# Patient Record
Sex: Female | Born: 1960 | Race: White | Hispanic: No | Smoking: Current every day smoker
Health system: Southern US, Community
[De-identification: ages and names within clinical notes are randomized; demographics above are authoritative.]

## PROBLEM LIST (undated history)

## (undated) DIAGNOSIS — E039 Hypothyroidism, unspecified: Secondary | ICD-10-CM

## (undated) DIAGNOSIS — E079 Disorder of thyroid, unspecified: Secondary | ICD-10-CM

## (undated) DIAGNOSIS — I219 Acute myocardial infarction, unspecified: Secondary | ICD-10-CM

## (undated) DIAGNOSIS — J439 Emphysema, unspecified: Secondary | ICD-10-CM

## (undated) DIAGNOSIS — E890 Postprocedural hypothyroidism: Secondary | ICD-10-CM

## (undated) DIAGNOSIS — F17200 Nicotine dependence, unspecified, uncomplicated: Secondary | ICD-10-CM

## (undated) DIAGNOSIS — E785 Hyperlipidemia, unspecified: Secondary | ICD-10-CM

## (undated) DIAGNOSIS — F419 Anxiety disorder, unspecified: Secondary | ICD-10-CM

## (undated) DIAGNOSIS — I10 Essential (primary) hypertension: Secondary | ICD-10-CM

## (undated) HISTORY — DX: Hypothyroidism, unspecified: E03.9

## (undated) HISTORY — DX: Nicotine dependence, unspecified, uncomplicated: F17.200

## (undated) HISTORY — DX: Hyperlipidemia, unspecified: E78.5

## (undated) HISTORY — PX: TRANSTHORACIC ECHOCARDIOGRAM: SHX275

## (undated) HISTORY — DX: Acute myocardial infarction, unspecified: I21.9

## (undated) HISTORY — PX: COLONOSCOPY: SHX174

## (undated) HISTORY — DX: Postprocedural hypothyroidism: E89.0

## (undated) HISTORY — DX: Emphysema, unspecified: J43.9

## (undated) HISTORY — PX: CORONARY ANGIOPLASTY WITH STENT PLACEMENT: SHX49

---

## 1998-04-30 ENCOUNTER — Inpatient Hospital Stay (HOSPITAL_COMMUNITY): Admission: AD | Admit: 1998-04-30 | Discharge: 1998-04-30 | Payer: Self-pay | Admitting: Obstetrics

## 1999-07-26 ENCOUNTER — Ambulatory Visit (HOSPITAL_COMMUNITY): Admission: RE | Admit: 1999-07-26 | Discharge: 1999-07-26 | Payer: Self-pay | Admitting: Gastroenterology

## 2000-11-27 ENCOUNTER — Other Ambulatory Visit: Admission: RE | Admit: 2000-11-27 | Discharge: 2000-11-27 | Payer: Self-pay | Admitting: Gastroenterology

## 2001-02-24 ENCOUNTER — Inpatient Hospital Stay (HOSPITAL_COMMUNITY): Admission: EM | Admit: 2001-02-24 | Discharge: 2001-02-28 | Payer: Self-pay | Admitting: Emergency Medicine

## 2001-02-26 ENCOUNTER — Encounter: Payer: Self-pay | Admitting: Internal Medicine

## 2001-02-28 ENCOUNTER — Encounter: Payer: Self-pay | Admitting: Internal Medicine

## 2001-12-04 ENCOUNTER — Emergency Department (HOSPITAL_COMMUNITY): Admission: EM | Admit: 2001-12-04 | Discharge: 2001-12-04 | Payer: Self-pay | Admitting: Emergency Medicine

## 2002-02-23 ENCOUNTER — Other Ambulatory Visit: Admission: RE | Admit: 2002-02-23 | Discharge: 2002-02-23 | Payer: Self-pay | Admitting: Internal Medicine

## 2004-04-17 ENCOUNTER — Other Ambulatory Visit: Admission: RE | Admit: 2004-04-17 | Discharge: 2004-04-17 | Payer: Self-pay | Admitting: Family Medicine

## 2004-05-01 ENCOUNTER — Encounter: Admission: RE | Admit: 2004-05-01 | Discharge: 2004-05-01 | Payer: Self-pay | Admitting: Family Medicine

## 2004-11-08 ENCOUNTER — Ambulatory Visit: Payer: Self-pay | Admitting: Family Medicine

## 2004-11-14 ENCOUNTER — Encounter: Admission: RE | Admit: 2004-11-14 | Discharge: 2004-11-14 | Payer: Self-pay | Admitting: Family Medicine

## 2004-11-21 ENCOUNTER — Encounter: Admission: RE | Admit: 2004-11-21 | Discharge: 2004-11-21 | Payer: Self-pay | Admitting: Family Medicine

## 2005-02-09 ENCOUNTER — Ambulatory Visit: Payer: Self-pay | Admitting: Family Medicine

## 2005-02-14 ENCOUNTER — Ambulatory Visit: Payer: Self-pay | Admitting: Gastroenterology

## 2005-02-15 ENCOUNTER — Encounter: Admission: RE | Admit: 2005-02-15 | Discharge: 2005-02-15 | Payer: Self-pay | Admitting: Family Medicine

## 2005-02-16 ENCOUNTER — Ambulatory Visit: Payer: Self-pay | Admitting: Gastroenterology

## 2005-03-13 ENCOUNTER — Ambulatory Visit: Payer: Self-pay | Admitting: Gastroenterology

## 2005-04-03 ENCOUNTER — Ambulatory Visit: Payer: Self-pay | Admitting: Family Medicine

## 2012-09-18 ENCOUNTER — Other Ambulatory Visit (HOSPITAL_COMMUNITY): Payer: Self-pay | Admitting: Nurse Practitioner

## 2012-09-18 DIAGNOSIS — Z139 Encounter for screening, unspecified: Secondary | ICD-10-CM

## 2012-09-23 ENCOUNTER — Ambulatory Visit (HOSPITAL_COMMUNITY)
Admission: RE | Admit: 2012-09-23 | Discharge: 2012-09-23 | Disposition: A | Discharge status: Home / Self Care | Payer: PRIVATE HEALTH INSURANCE | Source: Ambulatory Visit | Attending: Nurse Practitioner | Admitting: Nurse Practitioner

## 2012-09-23 DIAGNOSIS — Z139 Encounter for screening, unspecified: Secondary | ICD-10-CM

## 2012-09-30 ENCOUNTER — Other Ambulatory Visit: Payer: Self-pay | Admitting: Nurse Practitioner

## 2012-09-30 DIAGNOSIS — R928 Other abnormal and inconclusive findings on diagnostic imaging of breast: Secondary | ICD-10-CM

## 2012-10-13 ENCOUNTER — Other Ambulatory Visit (HOSPITAL_COMMUNITY): Payer: Self-pay | Admitting: Nurse Practitioner

## 2012-10-13 DIAGNOSIS — R928 Other abnormal and inconclusive findings on diagnostic imaging of breast: Secondary | ICD-10-CM

## 2012-10-22 ENCOUNTER — Ambulatory Visit (HOSPITAL_COMMUNITY)
Admission: RE | Admit: 2012-10-22 | Discharge: 2012-10-22 | Disposition: A | Discharge status: Home / Self Care | Payer: PRIVATE HEALTH INSURANCE | Source: Ambulatory Visit | Attending: Nurse Practitioner | Admitting: Nurse Practitioner

## 2012-10-22 ENCOUNTER — Other Ambulatory Visit (HOSPITAL_COMMUNITY): Payer: Self-pay | Admitting: Nurse Practitioner

## 2012-10-22 DIAGNOSIS — R928 Other abnormal and inconclusive findings on diagnostic imaging of breast: Secondary | ICD-10-CM | POA: Insufficient documentation

## 2016-06-13 ENCOUNTER — Other Ambulatory Visit: Payer: Self-pay | Admitting: Obstetrics and Gynecology

## 2016-06-13 DIAGNOSIS — R928 Other abnormal and inconclusive findings on diagnostic imaging of breast: Secondary | ICD-10-CM

## 2016-06-20 ENCOUNTER — Telehealth (HOSPITAL_COMMUNITY): Payer: Self-pay | Admitting: *Deleted

## 2016-06-20 NOTE — Telephone Encounter (Signed)
Telephoned patient at home # and left message about BCCCP appointment on July 13 2:30

## 2016-06-21 ENCOUNTER — Encounter (HOSPITAL_COMMUNITY): Payer: Self-pay

## 2016-06-21 ENCOUNTER — Ambulatory Visit (HOSPITAL_COMMUNITY): Payer: PRIVATE HEALTH INSURANCE

## 2016-06-21 ENCOUNTER — Ambulatory Visit (HOSPITAL_COMMUNITY)
Admission: RE | Admit: 2016-06-21 | Discharge: 2016-06-21 | Disposition: A | Discharge status: Home / Self Care | Payer: PRIVATE HEALTH INSURANCE | Source: Ambulatory Visit | Attending: Obstetrics and Gynecology | Admitting: Obstetrics and Gynecology

## 2016-06-21 ENCOUNTER — Other Ambulatory Visit: Payer: Self-pay | Admitting: Obstetrics and Gynecology

## 2016-06-21 ENCOUNTER — Ambulatory Visit
Admission: RE | Admit: 2016-06-21 | Discharge: 2016-06-21 | Disposition: A | Discharge status: Home / Self Care | Payer: Self-pay | Source: Ambulatory Visit | Attending: Obstetrics and Gynecology | Admitting: Obstetrics and Gynecology

## 2016-06-21 VITALS — BP 116/84 | Temp 97.6°F | Ht 67.0 in | Wt 123.6 lb

## 2016-06-21 DIAGNOSIS — R928 Other abnormal and inconclusive findings on diagnostic imaging of breast: Secondary | ICD-10-CM

## 2016-06-21 DIAGNOSIS — Z01419 Encounter for gynecological examination (general) (routine) without abnormal findings: Secondary | ICD-10-CM

## 2016-06-21 HISTORY — DX: Disorder of thyroid, unspecified: E07.9

## 2016-06-21 HISTORY — DX: Essential (primary) hypertension: I10

## 2016-06-21 NOTE — Progress Notes (Addendum)
Complaints of vaginal itching and redness.  Pap Smear: Pap smear completed today. Last Pap smear was 5 years ago and normal per patient. Per patient has a history of an abnormal Pap smear 22-24 years ago that cryotherapy was completed for follow up. Per patient all Pap smears have been normal since cryotherapy. No Pap smear results are in EPIC.  Physical exam: Breasts Breasts symmetrical. No skin abnormalities bilateral breasts. No nipple retraction bilateral breasts. No nipple discharge bilateral breasts. No lymphadenopathy. No lumps palpated bilateral breasts. No complaints of pain or tenderness on exam.  [facility] for a screening mammogram. Appointment scheduled for Thursday, June 21, 2016 at 1500.   Pelvic/Bimanual   Ext Genitalia A reddened area consistent with yeast was observed on external genitalia. No lesions, no swelling and no discharge observed on external genitalia. Recommended patient to try OTC Monistat.        Vagina Vagina pink and normal texture. No lesions and small amount of white cottage cheese discharge observed in vagina consistent with yeast. Recommended OTC Monistat and told patient to call if gets worse or if not better within a week. Told [facility] to [facility] a wet prep completed if discharge becomes worse. Patient requested STD testing. Told patient she can be screened at the [facility] free of charge.          Cervix Cervix is present. Cervix pink and of normal texture. No discharge observed.     Uterus Uterus is present and palpable. Uterus in normal position and normal size.        Adnexae Bilateral ovaries present and palpable. No tenderness on palpation.          Rectovaginal No rectal exam completed today since patient had no rectal complaints. No skin abnormalities observed on exam.     Smoking History: Patient is a current smoker. Discussed smoking cessation with patient.  [facility] and told her about the free classes offered at the [facility].  Patient Navigation: Patient education provided. Access to services provided for [facility].   Colorectal Cancer Screening: Patient had a colonoscopy completed in 2012. No complaints today.

## 2016-06-21 NOTE — Addendum Note (Signed)
Encounter addended by: Priscille Heidelberghristine P Brannock, RN on: 06/21/2016  3:23 PM<BR>     Documentation filed: Notes Section

## 2016-06-21 NOTE — Patient Instructions (Signed)
Educational materials on self breast awareness given. Explained to Rachael Duncan that  B[facility]CP will cover Pap smears and HPV typing every 5 years unless has a history of abnormal Pap smears. [facility] screening mamm[facility]. Appointment scheduled for Thursday, June 21, 2016 at 1500. Let patient know the Breast Center will follow up with her within the next couple weeks with results of mammogram by letter or phone. Discussed smoking cessation with patient. [facility] the [facility] and told her about the free classes offered at the [facility]. Rachael Duncan verbalized understanding.  Brannock, Kathaleen Maserhristine Poll, RN 3:08 PM

## 2016-06-22 ENCOUNTER — Encounter (HOSPITAL_COMMUNITY): Payer: Self-pay | Admitting: *Deleted

## 2016-06-22 LAB — CYTOLOGY - PAP

## 2016-06-25 ENCOUNTER — Telehealth (HOSPITAL_COMMUNITY): Payer: Self-pay | Admitting: *Deleted

## 2016-06-25 NOTE — Telephone Encounter (Signed)
Telephoned patient at home # and discussed negative pap smear results. HPV was negative. Next pap smear due in 5 years. Patient voiced understanding.  

## 2016-07-13 ENCOUNTER — Telehealth: Payer: Self-pay

## 2016-07-13 NOTE — Telephone Encounter (Signed)
Patient called back from previous call and I returned her call. Explained about [employer], Patient stated that had just gotten lab done and has not heard back from office. Patient stated that was interested but needed to see what lab work was first. Patient stated that would call back if needs to.

## 2016-07-13 NOTE — Telephone Encounter (Signed)
Called to inform about [facility] Program. Left voice mail to return call if interested.

## 2017-10-03 ENCOUNTER — Other Ambulatory Visit: Payer: Self-pay | Admitting: Obstetrics and Gynecology

## 2017-10-03 DIAGNOSIS — Z1231 Encounter for screening mammogram for malignant neoplasm of breast: Secondary | ICD-10-CM

## 2017-10-17 ENCOUNTER — Ambulatory Visit (HOSPITAL_COMMUNITY): Payer: No Typology Code available for payment source

## 2017-11-28 ENCOUNTER — Encounter (HOSPITAL_COMMUNITY): Payer: Self-pay

## 2017-11-28 ENCOUNTER — Ambulatory Visit (HOSPITAL_COMMUNITY)
Admission: RE | Admit: 2017-11-28 | Discharge: 2017-11-28 | Disposition: A | Discharge status: Home / Self Care | Payer: No Typology Code available for payment source | Source: Ambulatory Visit | Attending: Obstetrics and Gynecology | Admitting: Obstetrics and Gynecology

## 2017-11-28 ENCOUNTER — Ambulatory Visit
Admission: RE | Admit: 2017-11-28 | Discharge: 2017-11-28 | Disposition: A | Discharge status: Home / Self Care | Payer: No Typology Code available for payment source | Source: Ambulatory Visit | Attending: Obstetrics and Gynecology | Admitting: Obstetrics and Gynecology

## 2017-11-28 VITALS — BP 102/68 | Temp 97.6°F | Ht 67.0 in | Wt 120.0 lb

## 2017-11-28 DIAGNOSIS — Z1231 Encounter for screening mammogram for malignant neoplasm of breast: Secondary | ICD-10-CM

## 2017-11-28 DIAGNOSIS — Z1239 Encounter for other screening for malignant neoplasm of breast: Secondary | ICD-10-CM

## 2017-11-28 NOTE — Progress Notes (Signed)
No complaints today.   Pap Smear: Pap smear not completed today. [facility] and normal with negative HPV. Per patient has a history of an abnormal Pap smear 23-25 years ago that cryotherapy was completed for follow up. Per patient all Pap smears have been normal since cryotherapy. Last Pap smear result is in Epic.  Physical exam: Breasts Breasts symmetrical. No skin abnormalities bilateral breasts. No nipple retraction bilateral breasts. No nipple discharge bilateral breasts. No lymphadenopathy. No lumps palpated bilateral breasts. No complaints of pain or tenderness on exam. [facility] for a screening mamm[facility]. Appointment scheduled for Thursday, November 28, 2017 at 1410.      Pelvic/Bimanual No Pap smear completed today since last Pap smear and HPV typing was 06/21/2016. Pap smear not indicated per BCCCP guidelines.   Smoking History: Patient is a current smoker. Discussed smoking cessation with patient. Referred patient to the [facility]line and gave resources to free smoking cessation classes at [facility].  Patient Navigation: Patient education provided. Access to services provided for [facility] through [facility].   Colorectal Cancer Screening: Per patient had a colonoscopy completed in 2003. No complaints today. FIT Test given to patient to complete and return to BCCCP.

## 2017-11-28 NOTE — Patient Instructions (Addendum)
Explained breast self awareness with Harle Stanfordenise D Smith. Patient did not need a Pap smear today due to last Pap smear and HPV typing was 06/21/2016. Let her know BCCCP will cover smears and HPV typing every 5 years unless has a history of abnormal Pap smears. [facility] screening mamm[facility]. Appointment scheduled for Thursday, November 28, 2017 at 1410. Let patient know the Breast Center will follow up with her within the next couple weeks with results of mammogram by letter or phone. Discussed smoking cessation with patient. Referred patient to the [facility] and gave resources to free smoking cessation classes at [facility]. Harle Stanfordenise D Smith verbalized understanding.  Mykenzi Vanzile, Kathaleen Maserhristine Poll, RN 1:10 PM

## 2017-11-29 ENCOUNTER — Encounter (HOSPITAL_COMMUNITY): Payer: Self-pay | Admitting: *Deleted

## 2017-12-16 ENCOUNTER — Other Ambulatory Visit: Payer: Self-pay | Admitting: Obstetrics and Gynecology

## 2017-12-29 LAB — FECAL OCCULT BLOOD, IMMUNOCHEMICAL: Fecal Occult Bld: NEGATIVE

## 2017-12-30 ENCOUNTER — Encounter (HOSPITAL_COMMUNITY): Payer: Self-pay | Admitting: *Deleted

## 2017-12-30 NOTE — Progress Notes (Signed)
Negative Fit Test results mailed to patient.  

## 2018-09-08 ENCOUNTER — Other Ambulatory Visit: Payer: Self-pay | Admitting: Obstetrics and Gynecology

## 2018-09-08 DIAGNOSIS — Z1231 Encounter for screening mammogram for malignant neoplasm of breast: Secondary | ICD-10-CM

## 2018-09-17 ENCOUNTER — Telehealth: Payer: Self-pay | Admitting: *Deleted

## 2018-10-03 ENCOUNTER — Telehealth: Payer: Self-pay | Admitting: Family Medicine

## 2018-10-03 NOTE — Telephone Encounter (Signed)
Called patient to get her scheduled to come in this office to be seen. She stated she was going to [facility].

## 2018-10-06 ENCOUNTER — Encounter: Payer: No Typology Code available for payment source | Admitting: Family Medicine

## 2018-10-09 ENCOUNTER — Encounter: Payer: No Typology Code available for payment source | Admitting: Obstetrics & Gynecology

## 2018-12-11 ENCOUNTER — Encounter (HOSPITAL_COMMUNITY): Payer: Self-pay

## 2018-12-11 ENCOUNTER — Ambulatory Visit
Admission: RE | Admit: 2018-12-11 | Discharge: 2018-12-11 | Disposition: A | Discharge status: Home / Self Care | Payer: Self-pay | Source: Ambulatory Visit | Attending: Obstetrics and Gynecology | Admitting: Obstetrics and Gynecology

## 2018-12-11 ENCOUNTER — Ambulatory Visit (HOSPITAL_COMMUNITY)
Admission: RE | Admit: 2018-12-11 | Discharge: 2018-12-11 | Disposition: A | Discharge status: Home / Self Care | Payer: No Typology Code available for payment source | Source: Ambulatory Visit | Attending: Obstetrics and Gynecology | Admitting: Obstetrics and Gynecology

## 2018-12-11 VITALS — BP 128/86 | Wt 118.0 lb

## 2018-12-11 DIAGNOSIS — Z1231 Encounter for screening mammogram for malignant neoplasm of breast: Secondary | ICD-10-CM

## 2018-12-11 DIAGNOSIS — Z1239 Encounter for other screening for malignant neoplasm of breast: Secondary | ICD-10-CM

## 2018-12-11 HISTORY — DX: Anxiety disorder, unspecified: F41.9

## 2018-12-11 NOTE — Progress Notes (Signed)
No complaints today.   Pap Smear: Pap smear not completed today. Last Pap smear was 06/21/2016 in [facility] and normal with negative HPV. Per patient has a history of an abnormal Pap smear 24-26 years ago that cryotherapy was completed for follow up. Per patient all Pap smears have been normal since cryotherapy. Last Pap smear result is in Epic.  Physical exam: Breasts Breasts symmetrical. No skin abnormalities bilateral breasts. No nipple retraction bilateral breasts. No nipple discharge bilateral breasts. No lymphadenopathy. No lumps palpated bilateral breasts. No complaints of pain or tenderness on exam. Referred patient to the [facility] for a screening mammogram. Appointment scheduled for Thursday, December 11, 2018 at 1500.          Pelvic/Bimanual No Pap smear completed today since last Pap smear and HPV typing was 06/21/2016. Pap smear not indicated per BCCCP guidelines.  Smoking History: Patient is a current smoker. Discussed smoking cessation with patient. Referred patient to the [facility] Quitline and gave resources to free smoking cessation classes at [facility].  Patient Navigation: Patient education provided. Access to services provided for patient through [facility] program. Spanish interpreter provided.   Colorectal Cancer Screening: Per patient had a colonoscopy completed in 2003. No complaints today. FIT Test given to patient to complete and return to BCCCP.  Breast and Cervical Cancer Risk Assessment: Patient has no family history of breast cancer, known genetic mutations, or radiation treatment to the chest before age 10. Patient has no history of cervical dysplasia, immunocompromised, or DES exposure in-utero.  Risk Assessment    Risk Scores      12/11/2018   Last edited by: Lynnell Dike, LPN   5-year risk: 1 %   Lifetime risk: 6.7 %

## 2018-12-11 NOTE — Patient Instructions (Addendum)
Explained breast self awareness with Rachael Duncan. Patient did not need a Pap smear today due to last Pap smear and HPV typing was 06/21/2016. Let her know BCCCP will cover Pap smears and HPV typing every 5 years unless has a history of abnormal Pap smears. [facility] mamm[facility]. Appointment scheduled for Thursday, December 11, 2018 at 1500. Patient aware of appointment and will be there. Let patient know the Breast Center will follow up with her within the next couple weeks with results of mammogram by letter or phone. Discussed smoking cessation with patient. Ref[facility] the [facility] and gave resources to free smoking cessation classes at [facility]. Rachael Duncan verbalized understanding.  Brannock, Kathaleen Maserhristine Poll, RN 1:59 PM

## 2018-12-11 NOTE — Addendum Note (Signed)
Encounter addended by: Priscille Heidelberg, RN on: 12/11/2018 2:04 PM  Actions taken: Clinical Note Signed

## 2018-12-12 ENCOUNTER — Encounter (HOSPITAL_COMMUNITY): Payer: Self-pay | Admitting: *Deleted

## 2018-12-16 ENCOUNTER — Other Ambulatory Visit: Payer: Self-pay

## 2018-12-28 LAB — FECAL OCCULT BLOOD, IMMUNOCHEMICAL: Fecal Occult Bld: POSITIVE — AB

## 2018-12-31 ENCOUNTER — Telehealth (HOSPITAL_COMMUNITY): Payer: Self-pay | Admitting: *Deleted

## 2018-12-31 NOTE — Telephone Encounter (Signed)
Telephoned patient to give her results for her stool card and to suggest [facility].  Patient stated that her husband just died and she is too stressed at the moment to do [facility]. Patient stated that I should call back in February.

## 2019-06-30 IMAGING — MG DIGITAL SCREENING BILATERAL MAMMOGRAM WITH TOMO AND CAD
8 series · 9 of 24 positions shown · non-contrast
Comparison: Previous exam(s).

CLINICAL DATA: Screening.

EXAM:
DIGITAL SCREENING BILATERAL MAMMOGRAM WITH TOMO AND CAD

[R MLO synth-2D]
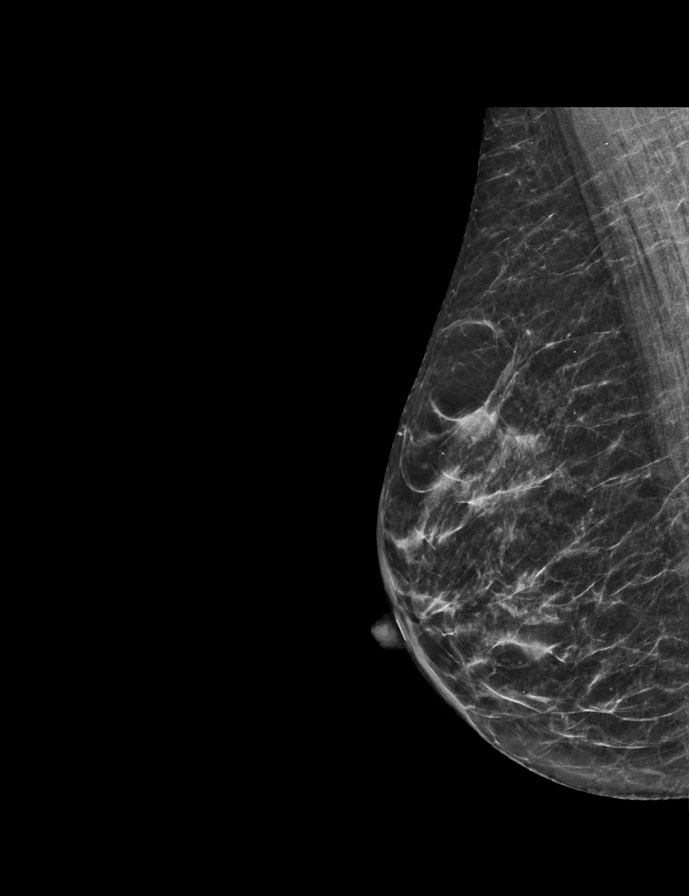

[L MLO synth-2D]
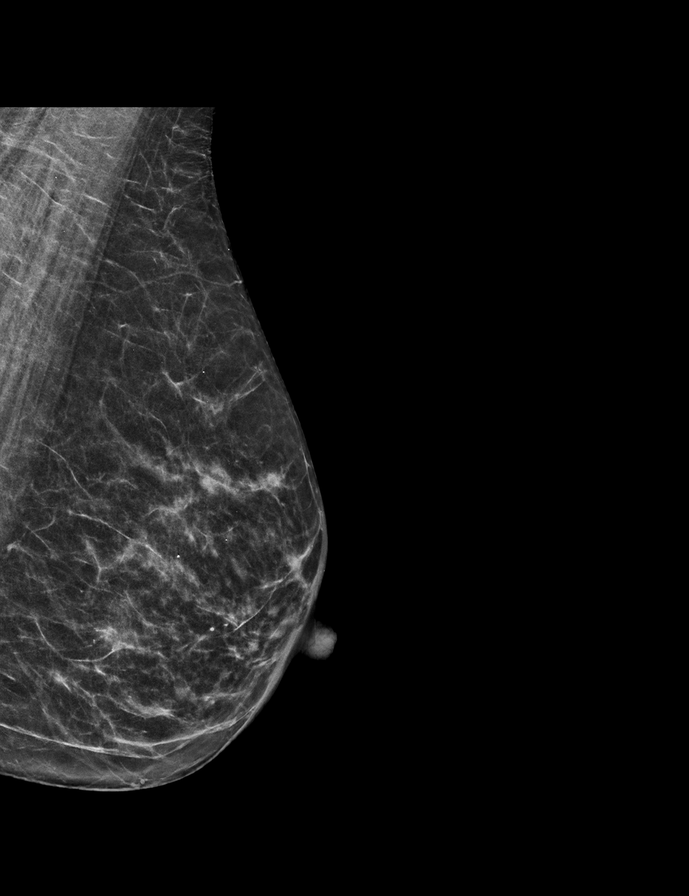

[R CC synth-2D]
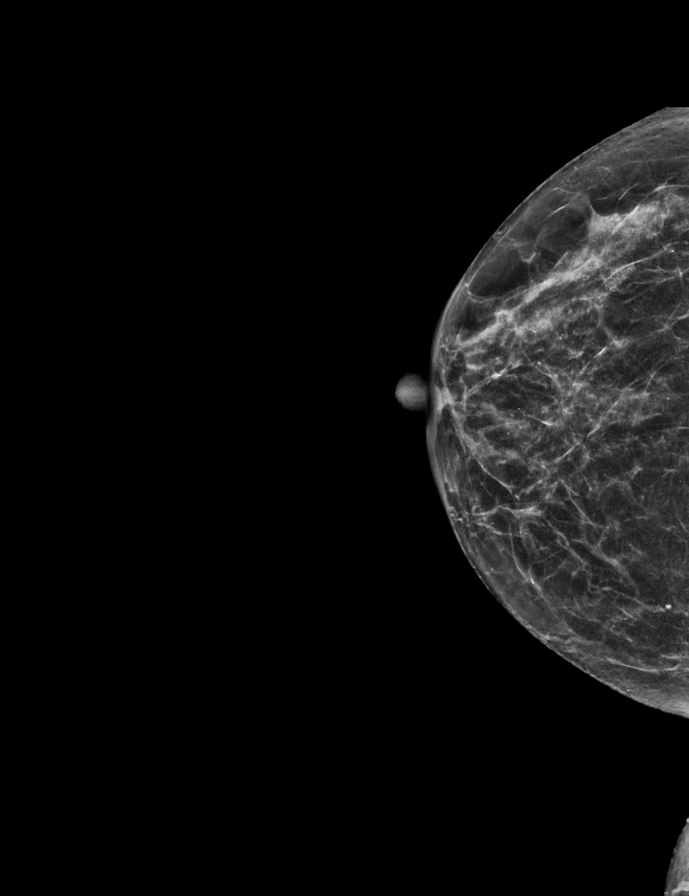

[L CC synth-2D]
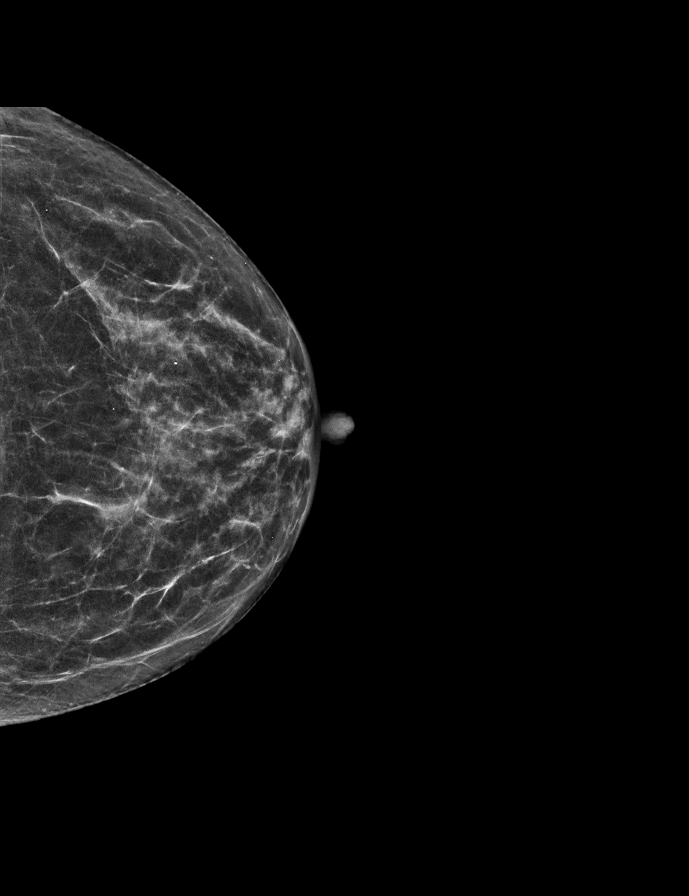

[R CC tomo · 2 of 48 frames shown]
[frame 16/48]
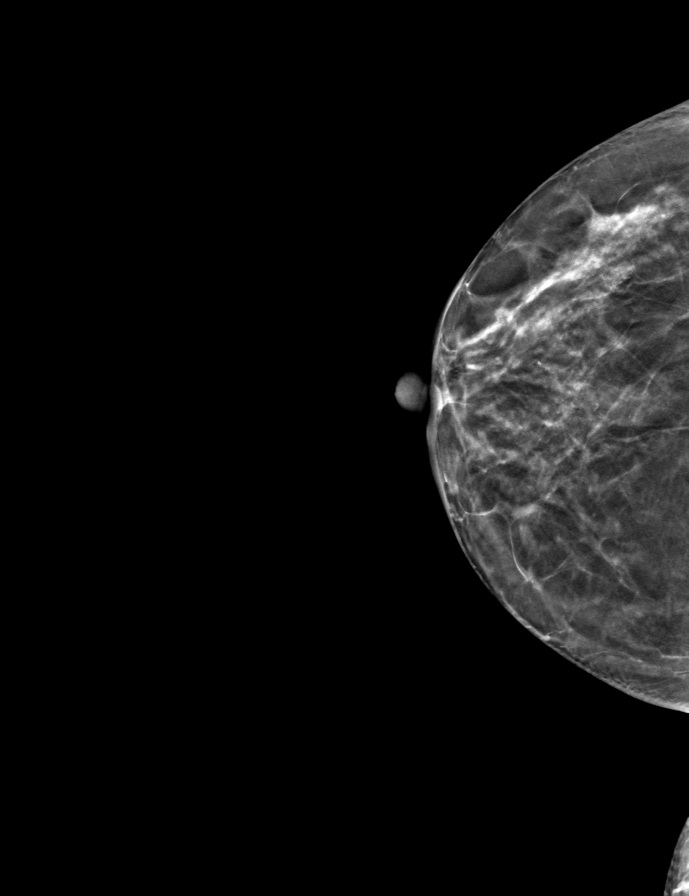
[frame 25/48]
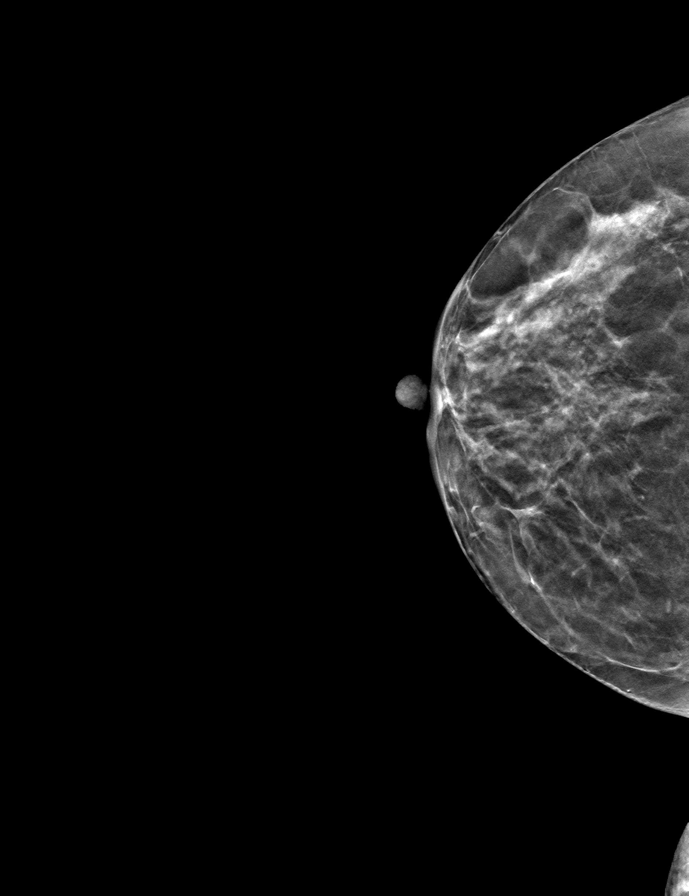

[R MLO tomo · tomo slice 25/48.0]
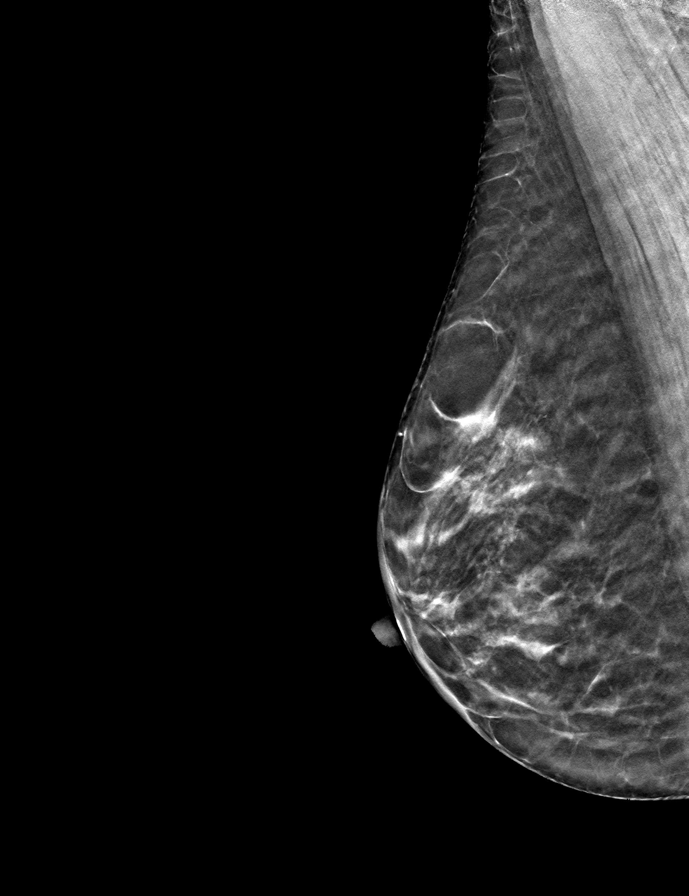

[L CC tomo · tomo slice 27/52.0]
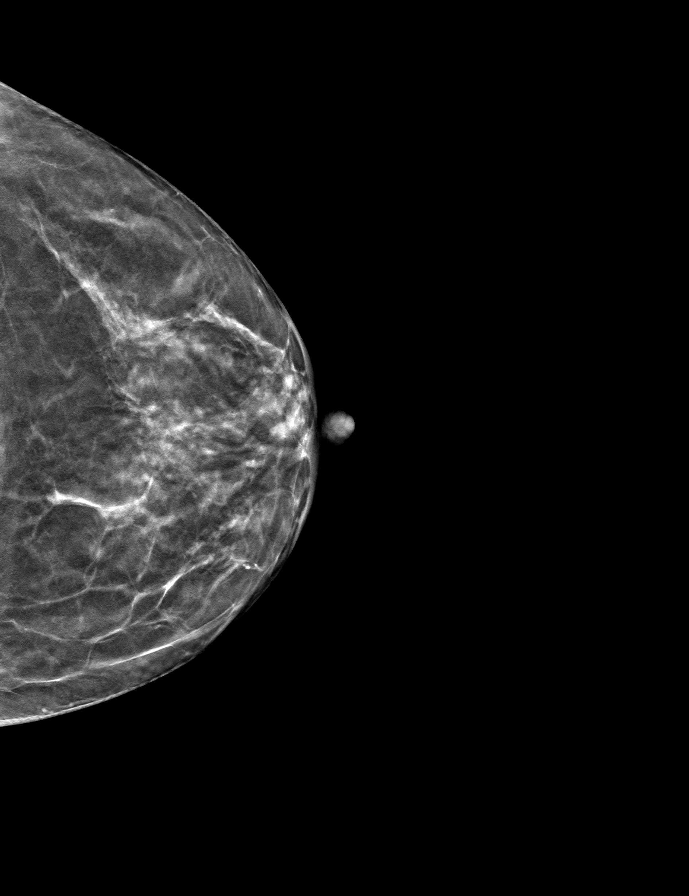

[L MLO tomo · tomo slice 26/51.0]
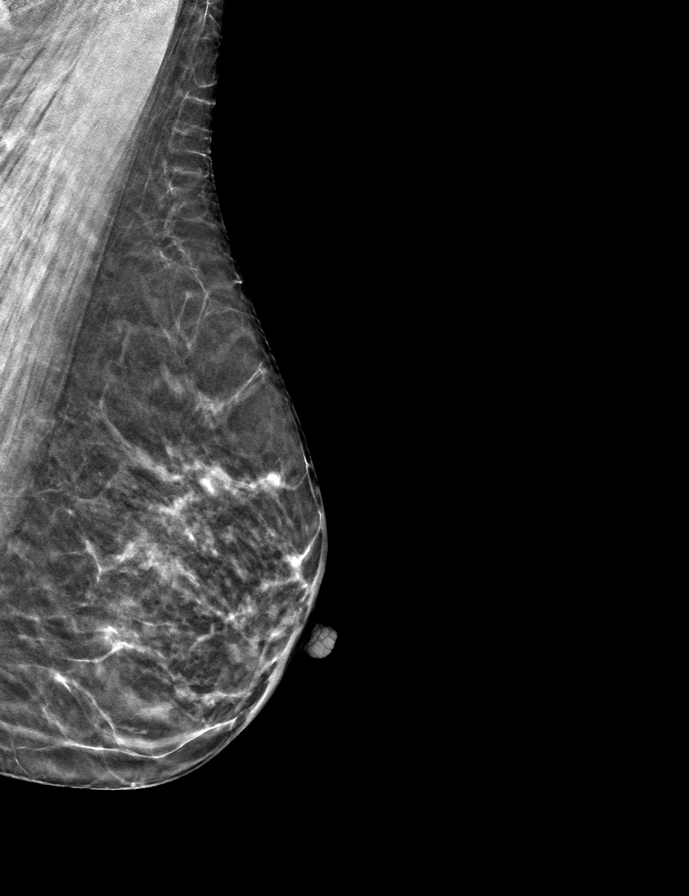

[9 of 24 positions shown; findings below may reference images not displayed]

ACR Breast Density Category b: There are scattered areas of
fibroglandular density.
FINDINGS: There are no findings suspicious for malignancy. Images were
processed with CAD.
IMPRESSION: No mammographic evidence of malignancy. A result letter of this
screening mammogram will be mailed directly to the patient.

RECOMMENDATION:
Screening mammogram in one year. (Code:[id number])

BI-RADS CATEGORY  1: Negative.

## 2020-03-17 ENCOUNTER — Ambulatory Visit: Payer: PRIVATE HEALTH INSURANCE | Attending: Internal Medicine

## 2020-03-17 DIAGNOSIS — Z23 Encounter for immunization: Secondary | ICD-10-CM

## 2020-03-17 NOTE — Progress Notes (Signed)
   Covid-19 Vaccination Clinic  Name:  Rachael Duncan    MRN: [id number] DOB: May 03, 1961  03/17/2020  Rachael Duncan was observed post Covid-19 immunization for 15 minutes without incident. She was provided with Vaccine Information Sheet and instruction to access the V-Safe system.   Rachael Duncan was instructed to call 911 with any severe reactions post vaccine: Marland Kitchen Difficulty breathing  . Swelling of face and throat  . A fast heartbeat  . A bad rash all over body  . Dizziness and weakness   Immunizations Administered    Name Date Dose VIS Date Route   Pfizer COVID-19 Vaccine 03/17/2020  1:37 PM 0.3 mL 11/20/2019 Intramuscular   Manufacturer: [employer], [employer]   Lot: [id number]   NDC: [address]-7

## 2020-04-11 ENCOUNTER — Ambulatory Visit: Payer: PRIVATE HEALTH INSURANCE | Attending: Internal Medicine

## 2020-04-11 DIAGNOSIS — Z23 Encounter for immunization: Secondary | ICD-10-CM

## 2020-04-11 NOTE — Progress Notes (Signed)
   Covid-19 Vaccination Clinic  Name:  Rachael Duncan    MRN: [id number] DOB: August 18, 1961  04/11/2020  [name] was observed post Covid-19 immunization for 15 minutes without incident. She was provided with Vaccine Information Sheet and instruction to access the V-Safe system.   [name] was instructed to call 911 with any severe reactions post vaccine: Marland Kitchen Difficulty breathing  . Swelling of face and throat  . A fast heartbeat  . A bad rash all over body  . Dizziness and weakness   Immunizations Administered    Name Date Dose VIS Date Route   Pfizer COVID-19 Vaccine 04/11/2020  1:34 PM 0.3 mL 02/03/2019 Intramuscular   Manufacturer: [employer], [employer]   Lot: [id number]   NDC: [address]-3

## 2022-12-05 ENCOUNTER — Other Ambulatory Visit: Payer: Self-pay | Admitting: Family Medicine

## 2022-12-05 DIAGNOSIS — Z1231 Encounter for screening mammogram for malignant neoplasm of breast: Secondary | ICD-10-CM

## 2023-02-06 ENCOUNTER — Ambulatory Visit
Admission: RE | Admit: 2023-02-06 | Discharge: 2023-02-06 | Disposition: A | Discharge status: Home / Self Care | Payer: PRIVATE HEALTH INSURANCE | Source: Ambulatory Visit | Attending: Family Medicine | Admitting: Family Medicine

## 2023-02-06 DIAGNOSIS — Z1231 Encounter for screening mammogram for malignant neoplasm of breast: Secondary | ICD-10-CM

## 2023-06-19 ENCOUNTER — Other Ambulatory Visit: Payer: Self-pay | Admitting: Family Medicine

## 2023-06-19 DIAGNOSIS — Z13820 Encounter for screening for osteoporosis: Secondary | ICD-10-CM

## 2023-06-19 DIAGNOSIS — F17200 Nicotine dependence, unspecified, uncomplicated: Secondary | ICD-10-CM

## 2024-01-24 ENCOUNTER — Ambulatory Visit: Payer: Medicaid Other | Admitting: Urgent Care

## 2024-01-24 ENCOUNTER — Encounter: Payer: Self-pay | Admitting: Urgent Care

## 2024-01-24 VITALS — BP 134/84 | HR 72 | Ht 64.0 in | Wt 119.8 lb

## 2024-01-24 DIAGNOSIS — Z114 Encounter for screening for human immunodeficiency virus [HIV]: Secondary | ICD-10-CM | POA: Diagnosis not present

## 2024-01-24 DIAGNOSIS — J432 Centrilobular emphysema: Secondary | ICD-10-CM

## 2024-01-24 DIAGNOSIS — E890 Postprocedural hypothyroidism: Secondary | ICD-10-CM

## 2024-01-24 DIAGNOSIS — Z122 Encounter for screening for malignant neoplasm of respiratory organs: Secondary | ICD-10-CM

## 2024-01-24 DIAGNOSIS — Z1159 Encounter for screening for other viral diseases: Secondary | ICD-10-CM

## 2024-01-24 DIAGNOSIS — I10 Essential (primary) hypertension: Secondary | ICD-10-CM

## 2024-01-24 DIAGNOSIS — F419 Anxiety disorder, unspecified: Secondary | ICD-10-CM | POA: Diagnosis not present

## 2024-01-24 DIAGNOSIS — S51812A Laceration without foreign body of left forearm, initial encounter: Secondary | ICD-10-CM

## 2024-01-24 DIAGNOSIS — Z131 Encounter for screening for diabetes mellitus: Secondary | ICD-10-CM | POA: Diagnosis not present

## 2024-01-24 DIAGNOSIS — F17200 Nicotine dependence, unspecified, uncomplicated: Secondary | ICD-10-CM

## 2024-01-24 DIAGNOSIS — E782 Mixed hyperlipidemia: Secondary | ICD-10-CM | POA: Diagnosis not present

## 2024-01-24 LAB — LIPID PANEL
Cholesterol: 141 mg/dL (ref 0–200)
HDL: 36.6 mg/dL — ABNORMAL LOW
LDL Cholesterol: 82 mg/dL (ref 0–99)
NonHDL: 104.19
Total CHOL/HDL Ratio: 4
Triglycerides: 113 mg/dL (ref 0.0–149.0)
VLDL: 22.6 mg/dL (ref 0.0–40.0)

## 2024-01-24 LAB — COMPREHENSIVE METABOLIC PANEL WITH GFR
ALT: 21 U/L (ref 0–35)
AST: 23 U/L (ref 0–37)
Albumin: 4.8 g/dL (ref 3.5–5.2)
Alkaline Phosphatase: 76 U/L (ref 39–117)
BUN: 11 mg/dL (ref 6–23)
CO2: 26 meq/L (ref 19–32)
Calcium: 9.7 mg/dL (ref 8.4–10.5)
Chloride: 103 meq/L (ref 96–112)
Creatinine, Ser: 0.79 mg/dL (ref 0.40–1.20)
GFR: 81.32 mL/min
Glucose, Bld: 102 mg/dL — ABNORMAL HIGH (ref 70–99)
Potassium: 4.4 meq/L (ref 3.5–5.1)
Sodium: 139 meq/L (ref 135–145)
Total Bilirubin: 0.8 mg/dL (ref 0.2–1.2)
Total Protein: 7.9 g/dL (ref 6.0–8.3)

## 2024-01-24 LAB — T4, FREE: Free T4: 0.69 ng/dL (ref 0.60–1.60)

## 2024-01-24 LAB — CBC WITH DIFFERENTIAL/PLATELET
Basophils Absolute: 0.1 K/uL (ref 0.0–0.1)
Basophils Relative: 1.1 % (ref 0.0–3.0)
Eosinophils Absolute: 0.9 K/uL — ABNORMAL HIGH (ref 0.0–0.7)
Eosinophils Relative: 9.6 % — ABNORMAL HIGH (ref 0.0–5.0)
HCT: 47 % — ABNORMAL HIGH (ref 36.0–46.0)
Hemoglobin: 15.8 g/dL — ABNORMAL HIGH (ref 12.0–15.0)
Lymphocytes Relative: 30.6 % (ref 12.0–46.0)
Lymphs Abs: 2.7 K/uL (ref 0.7–4.0)
MCHC: 33.6 g/dL (ref 30.0–36.0)
MCV: 96 fl (ref 78.0–100.0)
Monocytes Absolute: 0.6 K/uL (ref 0.1–1.0)
Monocytes Relative: 6.4 % (ref 3.0–12.0)
Neutro Abs: 4.7 K/uL (ref 1.4–7.7)
Neutrophils Relative %: 52.3 % (ref 43.0–77.0)
Platelets: 282 K/uL (ref 150.0–400.0)
RBC: 4.89 Mil/uL (ref 3.87–5.11)
RDW: 12.2 % (ref 11.5–15.5)
WBC: 8.9 K/uL (ref 4.0–10.5)

## 2024-01-24 LAB — TSH: TSH: 2.36 u[IU]/mL (ref 0.35–5.50)

## 2024-01-24 LAB — HEMOGLOBIN A1C: Hgb A1c MFr Bld: 5.5 % (ref 4.6–6.5)

## 2024-01-24 MED ORDER — UMECLIDINIUM-VILANTEROL 62.5-25 MCG/ACT IN AEPB: 1.0000 | INHALATION_SPRAY | Freq: Every day | RESPIRATORY_TRACT | 5 refills | Status: AC

## 2024-01-24 MED ORDER — ALPRAZOLAM 0.5 MG PO TABS: 0.5000 mg | ORAL_TABLET | Freq: Two times a day (BID) | ORAL | 0 refills | Status: DC | PRN

## 2024-01-24 MED ORDER — METOPROLOL TARTRATE 25 MG PO TABS: 25.0000 mg | ORAL_TABLET | Freq: Two times a day (BID) | ORAL | 2 refills | Status: DC

## 2024-01-24 MED ORDER — ALBUTEROL SULFATE 108 (90 BASE) MCG/ACT IN AEPB: 2.0000 | INHALATION_SPRAY | Freq: Four times a day (QID) | RESPIRATORY_TRACT | 6 refills | Status: AC | PRN

## 2024-01-24 MED ORDER — LEVOTHYROXINE SODIUM 75 MCG PO TABS: 75.0000 ug | ORAL_TABLET | Freq: Every day | ORAL | 1 refills | Status: DC

## 2024-01-24 MED ORDER — ROSUVASTATIN CALCIUM 10 MG PO TABS: 10.0000 mg | ORAL_TABLET | Freq: Every day | ORAL | 1 refills | Status: DC

## 2024-01-24 MED ORDER — VENLAFAXINE HCL ER 75 MG PO CP24: 75.0000 mg | ORAL_CAPSULE | Freq: Every day | ORAL | 2 refills | Status: DC

## 2024-01-24 NOTE — Patient Instructions (Addendum)
Start effexor once daily. This will help control anxiety. Cut back to alprazolam twice daily with a goal of cutting back to only at night. This medication is addictive and can require doseage inefficacy with long term use  Start the anoro ellipta - take one puff daily. This should help with your emphysema. Use the inhaler as needed.  Take all your routine medications as previously prescribed.  Please call to schedule your chest CT: [facility] Address: [address], [place], [place] [address] Phone: [phone]  Try to cut back as we discussed on smoking. This will save lots of money and prevent further cardiac and respiratory issues.  Please return in 2 weeks to update your pap smear.

## 2024-01-24 NOTE — Progress Notes (Signed)
New Patient Office Visit  Subjective:  Patient ID: Rachael Duncan, female    DOB: Dec 17, 1960  Age: 63 y.o. MRN: [id number]  CC:  Chief Complaint  Patient presents with   Establish Care    New pt est care. Pt did have an almost fall last night and has a scratch on her arm. She also has issues with elevated Bp.    HPI Rachael Duncan presents to establish care. Is transferring care from [facility].   Discussed the use of AI scribe software for clinical note transcription with the patient, who gave verbal consent to proceed.  History of Present Illness   Rachael Duncan is a 63 year old female with post-ablative hypothyroidism who presents for a follow-up visit.  She has post-ablative hypothyroidism managed with levothyroxine 75 mcg daily, recently increased from 50 mcg. She attributes some fatigue to her thyroid condition and is due for routine lab work to monitor thyroid levels. Previously had graves disease.   She has a history of hypertension, managed with metoprolol twice daily. Well controlled, denies side effects to the medication. Does have hx of MI, pt continues to smoke. Pt does have stent placement.  She previously used Claritin with Sudafed for nasal congestion, but discontinued due to cost and potential blood pressure issues. She uses a nasal spray and over-the-counter allergy medication for chronic nasal congestion, which she attributes to her work environment.  She has a long history of smoking since age 28, currently smoking about half a pack a day. She experiences nocturnal wheezing and uses albuterol, though her inhaler is out of date. She has a hx of emphysema noted on CT lung cancer screening from 05/08/22.   CT findings as noted: Lungs and pleura:  Patent central airways. No pleural effusion or pneumothorax. Mild centrilobular and paraseptal emphysema. Diffuse endobronchial thickening.   Mediastinum/Soft Tissues:  Moderate coronary calcifications. No  adenopathy.   Upper Abdomen:  Grossly unremarkable allowing for low dose technique.   Bones:  No acute or aggressive bony abnormality.   Nodule assessment:  1.  4 mm left upper lobe nodule on image 305.   2.  3 mm left upper lobe nodule on image 250.   3.  2 mm right upper lobe nodule on image 418.   4.  4 mm right upper lobe pulmonary nodule on image 274.   IMPRESSION/  RECOMMENDATIONS:  Continue annual Screening low dose chest CT (exam code [id number] [phone]).   ASSESSMENT: Lung-RADSTM CATEGORY: 2-Benign appearance or behavior. Pt admits that no additional screening has been completed since.  She has a history of anxiety, managed with alprazolam, usually two and a half tablets during the day and one at night. She experiences bad dreams and anxiety attacks, particularly after the loss of her husband and dog. She has not been on any preventative medication for anxiety. (No hx of SSRI therapy). Pt admits that the medication is not as effective as it several years ago  She reports joint pain and swelling, particularly in her knuckles, and suspects arthritis. She experiences significant body pain upon waking. She works two days a week and engages in minimal physical activity, primarily housework and occasional exercise on a walker.  She had a lung cancer screening in 2023, which recommended annual follow-up. She has not had a Pap smear since 2017 and had a normal mammogram in March 2024.   Lastly, pt concerned about a skin tear that occurred last night to L posterior forearm. Scraped  it at home, bleeding intermittently if banged. Tdap UTD. Pt cleaned it out at home prior.      Outpatient Encounter Medications as of 01/24/2024  Medication Sig   Albuterol Sulfate (PROAIR RESPICLICK) 108 (90 Base) MCG/ACT AEPB Inhale 2 puffs into the lungs every 6 (six) hours as needed.   aspirin EC 81 MG tablet Take 81 mg by mouth daily.   umeclidinium-vilanterol (ANORO ELLIPTA) 62.5-25 MCG/ACT AEPB Inhale 1  puff into the lungs daily at 6 (six) AM.   venlafaxine XR (EFFEXOR XR) 75 MG 24 hr capsule Take 1 capsule (75 mg total) by mouth daily with breakfast.   [DISCONTINUED] albuterol (PROAIR HFA) 108 (90 Base) MCG/ACT inhaler 2 puffs as needed Inhalation every 4 hrs for Asthma exacerbation for 90 days   [DISCONTINUED] ALPRAZolam (XANAX) 0.25 MG tablet Take 0.5 mg by mouth 3 (three) times daily as needed for anxiety.   [DISCONTINUED] ALPRAZolam (XANAX) 0.5 MG tablet Take by mouth.   [DISCONTINUED] levothyroxine (SYNTHROID) 75 MCG tablet Take 75 mcg by mouth daily.   [DISCONTINUED] loratadine-pseudoephedrine (CLARITIN-D 24-HOUR) 10-240 MG 24 hr tablet Take 1 tablet by mouth daily.   [DISCONTINUED] metoprolol tartrate (LOPRESSOR) 25 MG tablet Take 25 mg by mouth 2 (two) times daily.   [DISCONTINUED] rosuvastatin (CRESTOR) 10 MG tablet Take 10 mg by mouth daily.   ALPRAZolam (XANAX) 0.5 MG tablet Take 1 tablet (0.5 mg total) by mouth 2 (two) times daily as needed for anxiety.   levothyroxine (SYNTHROID) 75 MCG tablet Take 1 tablet (75 mcg total) by mouth daily.   metoprolol tartrate (LOPRESSOR) 25 MG tablet Take 1 tablet (25 mg total) by mouth 2 (two) times daily.   rosuvastatin (CRESTOR) 10 MG tablet Take 1 tablet (10 mg total) by mouth daily.   [DISCONTINUED] levothyroxine (SYNTHROID, LEVOTHROID) 50 MCG tablet Take 50 mcg by mouth daily before breakfast.   No facility-administered encounter medications on file as of 01/24/2024.    Past Medical History:  Diagnosis Date   Anxiety    Emphysema of lung (HCC)    Hyperlipidemia    Hypertension    [facility]ocardial infarction [facility])    Thyroid disease     History reviewed. No pertinent surgical history.  Family History  Problem Relation Age of Onset   Cancer Maternal Grandmother    Hypertension Mother    Hypertension Father     Social History   Socioeconomic History   Marital status: Widowed    Spouse name: Not on file   Number of children: 2    Years of education: Not on file   Highest education level: 12th grade  Occupational History   Not on file  Tobacco Use   Smoking status: Every Day    Current packs/day: 0.50    Average packs/day: 0.5 packs/day for 40.0 years (20.0 ttl pk-yrs)    Types: Cigarettes   Smokeless tobacco: Never  Vaping Use   Vaping status: Never Used  Substance and Sexual Activity   Alcohol use: Yes    Alcohol/week: 6.0 standard drinks of alcohol    Types: 6 Cans of beer per week   Drug use: No   Sexual activity: Yes    Partners: Male    Birth control/protection: None  Other Topics Concern   Not on file  Social History Narrative   Not on file   Social Drivers of Health   Financial Resource Strain: Not on file  Food Insecurity: Not on file  Transportation Needs: No Transportation Needs (12/11/2018)  PRAPARE - Administrator, Civil Service (Medical): No    Lack of Transportation (Non-Medical): No  Physical Activity: Inactive (11/28/2017)   Exercise Vital Sign    Days of Exercise per Week: 0 days    Minutes of Exercise per Session: 0 min  Stress: Stress Concern Present (11/28/2017)   [employer] of Occupational Health - Occupational Stress Questionnaire    Feeling of Stress : To some extent  Social Connections: Unknown (04/23/2022)   Received from [facility], [facility]   Social Network    Social Network: Not on file  Intimate Partner Violence: Unknown (04/23/2022)   Received from [facility], [facility]   HITS    Physically Hurt: Not on file    Insult or Talk Down To: Not on file    Threaten Physical Harm: Not on file    Scream or Curse: Not on file    ROS: as noted in HPI  Objective:  BP 134/84   Pulse 72   Ht 5\' 4"  (1.626 m)   Wt 119 lb 12.8 oz (54.3 kg)   SpO2 100%   BMI 20.56 kg/m   Physical Exam Vitals and nursing note reviewed.  Constitutional:      General: She is not in acute distress.    Appearance: Normal appearance. She is not  ill-appearing, toxic-appearing or diaphoretic.     Comments: Thin appearing, chronically ill  HENT:     Head: Normocephalic and atraumatic.     Right Ear: Tympanic membrane, ear canal and external ear normal. There is no impacted cerumen.     Left Ear: Tympanic membrane, ear canal and external ear normal. There is no impacted cerumen.     Nose: Nose normal.     Mouth/Throat:     Mouth: Mucous membranes are moist.     Pharynx: Oropharynx is clear. No oropharyngeal exudate or posterior oropharyngeal erythema.  Eyes:     General: No scleral icterus.       Right eye: No discharge.        Left eye: No discharge.     Extraocular Movements: Extraocular movements intact.     Pupils: Pupils are equal, round, and reactive to light.  Neck:     Thyroid: No thyroid mass, thyromegaly or thyroid tenderness.  Cardiovascular:     Rate and Rhythm: Normal rate and regular rhythm.     Pulses: Normal pulses.     Heart sounds: No murmur heard. Pulmonary:     Effort: Pulmonary effort is normal. No respiratory distress.     Breath sounds: Normal breath sounds. No stridor. No wheezing or rhonchi.     Comments: Diminished breath sounds with scant wheezing Abdominal:     General: Abdomen is flat. Bowel sounds are normal. There is no distension.     Palpations: Abdomen is soft. There is no mass.     Tenderness: There is no abdominal tenderness. There is no guarding.  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     Cervical back: Normal range of motion and neck supple. No rigidity or tenderness.     Right lower leg: No edema.     Left lower leg: No edema.  Lymphadenopathy:     Cervical: No cervical adenopathy.  Skin:    General: Skin is warm and dry.     Coloration: Skin is not jaundiced.     Findings: No erythema or rash.  Comments: Skin tear to L posterior forearm midline; no FB, no drainage. No bleeding  Neurological:     General: No focal deficit present.      Mental Status: She is alert and oriented to person, place, and time.     Sensory: No sensory deficit.     Motor: No weakness.  Psychiatric:        Mood and Affect: Mood normal.        Behavior: Behavior normal.    Laceration repair  Date/Time: 01/24/2024 11:48 AM  Performed by: Maretta Bees, PA Authorized by: Maretta Bees, PA   Consent:    Consent obtained:  Verbal   Consent given by:  Patient   Risks, benefits, and alternatives were discussed: yes     Risks discussed:  Infection, pain, poor cosmetic result and poor wound healing   Alternatives discussed:  No treatment Anesthesia:    Anesthesia method:  None Laceration details:    Location:  Shoulder/arm   Shoulder/arm location:  L lower arm   Length (cm):  2   Depth (mm):  2 Skin repair:    Repair method:  Steri-Strips   Number of Steri-Strips:  3 Approximation:    Approximation:  Close Post-procedure details:    Dressing:  Open (no dressing)     01/24/2024    1:09 PM  Depression screen PHQ 2/9  Decreased Interest 2  Down, Depressed, Hopeless 2  PHQ - 2 Score 4  Altered sleeping 2  Tired, decreased energy 2  Change in appetite 0  Feeling bad or failure about yourself  1  Trouble concentrating 1  Moving slowly or fidgety/restless 0  Suicidal thoughts 0  PHQ-9 Score 10  Difficult doing work/chores Somewhat difficult       01/24/2024    1:10 PM  GAD 7 : Generalized Anxiety Score  Nervous, Anxious, on Edge 3  Control/stop worrying 2  Worry too much - different things 2  Trouble relaxing 2  Restless 0  Easily annoyed or irritable 2  Afraid - awful might happen 3  Total GAD 7 Score 14  Anxiety Difficulty Somewhat difficult        Assessment & Plan:  Mixed hyperlipidemia -     Hemoglobin A1c -     Lipid panel -     Rosuvastatin Calcium; Take 1 tablet (10 mg total) by mouth daily.  Dispense: 90 tablet; Refill: 1  Primary hypertension -     CBC with Differential/Platelet -     Hemoglobin  A1c -     TSH -     Comprehensive metabolic panel -     Metoprolol Tartrate; Take 1 tablet (25 mg total) by mouth 2 (two) times daily.  Dispense: 90 tablet; Refill: 2  Anxiety -     ALPRAZolam; Take 1 tablet (0.5 mg total) by mouth 2 (two) times daily as needed for anxiety.  Dispense: 60 tablet; Refill: 0 -     Venlafaxine HCl ER; Take 1 capsule (75 mg total) by mouth daily with breakfast.  Dispense: 90 capsule; Refill: 2  Tobacco dependence -     Albuterol Sulfate; Inhale 2 puffs into the lungs every 6 (six) hours as needed.  Dispense: 1 each; Refill: 6 -     CT CHEST LUNG CANCER SCREENING LOW DOSE WO CONTRAST; Future  Encounter for screening for HIV -     HIV Antibody (routine testing w rflx)  Need for hepatitis C screening test -  Hepatitis C antibody  Screening for lung cancer -     CT CHEST LUNG CANCER SCREENING LOW DOSE WO CONTRAST; Future  Diabetes mellitus screening -     Hemoglobin A1c  Postablative hypothyroidism -     TSH -     T3 -     T4, free -     Levothyroxine Sodium; Take 1 tablet (75 mcg total) by mouth daily.  Dispense: 90 tablet; Refill: 1  Centrilobular emphysema (HCC) -     Umeclidinium-Vilanterol; Inhale 1 puff into the lungs daily at 6 (six) AM.  Dispense: 60 each; Refill: 5 -     Albuterol Sulfate; Inhale 2 puffs into the lungs every 6 (six) hours as needed.  Dispense: 1 each; Refill: 6 -     CT CHEST LUNG CANCER SCREENING LOW DOSE WO CONTRAST; Future  Skin tear of forearm without complication, left, initial encounter -     Laceration repair  Assessment and Plan    Post-ablative Hypothyroidism On Levothyroxine daily. Recent dose change from to . -Order thyroid function tests to assess current dose adequacy.  Hyperlipidemia On Rosuvastatin. -Continue Rosuvastatin.  Hypertension On Metoprolol BID. -Continue Metoprolol.  Chronic Nasal Congestion Using over-the-counter Benadryl and nasal spray. -Advise to continue using  nasal spray and switch to Loratadine for oral antihistamine. Must avoid use of sudafed.  Anxiety On Alprazolam, with recent increase in anxiety symptoms. -Start Fluoxetine for anxiety prevention. -Continue Alprazolam, but aim to decrease frequency of use. Will start by decreasing to BID with goal of changing to PRN. Must wean slowly as pt has been on TID dosing for years.  COPD/ emphysema History of smoking, on Albuterol as needed. -Start Anoro Ellipta for symptom prevention. -Order lung cancer screening CT scan due to history of smoking and previous nodules.  Arthritis Reports joint and muscle pain, using over-the-counter Tylenol. -consider starting Meloxicam for anti-inflammatory effect pending renal function labs.  Skin Tear Presented with skin tear on L forearm. -Applied steri-strips to skin tear.  General Health Maintenance -Order routine labs. -Advise to continue baby aspirin. -Plan Pap smear in 2 weeks. -Advise to quit smoking, consider nicotine replacement therapy vs chantix or wellbutrin. -Advise to take Levothyroxine on an empty stomach, separate from other medications.      Total time spent with patient including face to face, chart review and documentation, personal interpretation of outside labs and imaging, counseling, and procedure was 65 minutes.  Return in about 2 weeks (around 02/07/2024).   Maretta Bees, PA

## 2024-01-25 LAB — T3: T3, Total: 80 ng/dL (ref 76–181)

## 2024-01-25 LAB — HEPATITIS C ANTIBODY: Hepatitis C Ab: NONREACTIVE

## 2024-01-25 LAB — HIV ANTIBODY (ROUTINE TESTING W REFLEX): HIV 1&2 Ab, 4th Generation: NONREACTIVE

## 2024-02-26 ENCOUNTER — Telehealth: Payer: Self-pay

## 2024-02-26 ENCOUNTER — Other Ambulatory Visit (HOSPITAL_COMMUNITY): Payer: Self-pay

## 2024-02-26 NOTE — Telephone Encounter (Signed)
 I do not have any records that ventolin has been tried. The reason to try Proair respiclick was because she is unable to take a deep inhalation at the same time as pushing down on the actuator and thus is not really getting benefit from the regular inhaler.

## 2024-02-26 NOTE — Telephone Encounter (Signed)
 Pharmacy Patient Advocate Encounter   Received notification from CoverMyMeds that prior authorization for ProAir RespiClick  is required/requested.   Insurance verification completed.   The patient is insured through [employer] .   Per test claim: PA required; However, NEW/RECENT must have HAD TRIED AND FAIL VENTOLIN HFA The ABOVE medications may be covered If clinically appropriate, you may change the prescription. A therapeutic alternative may be available.  TEST CLAIM REVEALED WILL COVER VENTOLIN HFA FOR 4.00. SHOULD WE PROCEED WITH PA OR WOULD YOU LIKE TO TRY THE ALTERNATIVE.

## 2024-02-28 ENCOUNTER — Other Ambulatory Visit (HOSPITAL_COMMUNITY): Payer: Self-pay

## 2024-02-28 NOTE — Telephone Encounter (Signed)
 Yes please

## 2024-02-28 NOTE — Telephone Encounter (Signed)
 Pharmacy Patient Advocate Encounter   Received notification from CoverMyMeds that prior authorization for PROAIR respiclick  is required/requested.   Insurance verification completed.   The patient is insured through [facility] .   Per test claim: PA required; PA submitted to above mentioned insurance via CoverMyMeds Key/confirmation #/EOC [employer] Status is pending/SENT TO PLAN

## 2024-03-02 ENCOUNTER — Other Ambulatory Visit: Payer: Self-pay | Admitting: Urgent Care

## 2024-03-02 DIAGNOSIS — F419 Anxiety disorder, unspecified: Secondary | ICD-10-CM

## 2024-03-02 NOTE — Telephone Encounter (Signed)
 Copied from CRM [id number]. Topic: Clinical - Medication Refill >> Mar 02, 2024 12:16 PM Florestine Avers wrote: Most Recent Primary Care Visit:  Provider: Maretta Bees  Department: [facility]  Visit Type: NEW PT - OFFICE VISIT  Date: 01/24/2024  Medication: ALPRAZolam Prudy Feeler) 0.5 MG tablet  Has the patient contacted their pharmacy? Yes (Agent: If no, request that the patient contact the pharmacy for the refill. If patient does not wish to contact the pharmacy document the reason why and proceed with request.) (Agent: If yes, when and what did the pharmacy advise?)  Is this the correct pharmacy for this prescription? Yes If no, delete pharmacy and type the correct one.  This is the patient's preferred pharmacy:  [facility] [address] [place] - [address] [place] [address] Phone: [phone] Fax: [phone]   Has the prescription been filled recently? No  Is the patient out of the medication? Yes  Has the patient been seen for an appointment in the last year OR does the patient have an upcoming appointment? Yes  Can we respond through MyChart? Yes  Agent: Please be advised that Rx refills may take up to 3 business days. We ask that you follow-up with your pharmacy.

## 2024-03-03 ENCOUNTER — Other Ambulatory Visit (HOSPITAL_COMMUNITY): Payer: Self-pay

## 2024-03-03 ENCOUNTER — Telehealth: Payer: Self-pay | Admitting: Pharmacist

## 2024-03-03 MED ORDER — ALPRAZOLAM 0.5 MG PO TABS
0.5000 mg | ORAL_TABLET | Freq: Two times a day (BID) | ORAL | 0 refills | Status: DC | PRN
Start: 2024-03-03 — End: 2024-03-04

## 2024-03-03 NOTE — Telephone Encounter (Signed)
 Requesting: ALPRAZolam (XANAX) 0.5 MG tablet  Contract: N/A UDS: N/A Last Visit: 01/24/24 Next Visit: advised to f/u 2 weeks Last Refill: 01/24/24 (60,0)  Please Advise

## 2024-03-03 NOTE — Telephone Encounter (Signed)
 Pharmacy Patient Advocate Encounter  Received notification from [facility] that Prior Authorization for ProAir RespiClick 108 (90 Base)MCG/ACT aerosol powder has been DENIED. SEE BELOW.  No denial letter received via Fax or CMM. It has been requested and will be uploaded to the media tab once received.   PA #/Case ID/Reference #: [id number]  Per test claim:

## 2024-03-03 NOTE — Telephone Encounter (Signed)
 Appeal has been submitted for ProAir RespiClick. Will advise when response is received, please be advised that most companies may take 30 days to make a decision. Appeal letter and supporting documentation have been faxed to [phone] on 03/03/2024 @1 :26 pm.  Thank you, Dellie Burns, PharmD Clinical Pharmacist  [place]  Direct Dial: [phone]

## 2024-03-03 NOTE — Telephone Encounter (Signed)
 Ok thank you. Let me know if there is anything else I need to do

## 2024-03-04 ENCOUNTER — Other Ambulatory Visit: Payer: Self-pay | Admitting: Urgent Care

## 2024-03-04 ENCOUNTER — Other Ambulatory Visit: Payer: Self-pay

## 2024-03-04 ENCOUNTER — Ambulatory Visit: Payer: Self-pay | Admitting: Urgent Care

## 2024-03-04 DIAGNOSIS — J432 Centrilobular emphysema: Secondary | ICD-10-CM

## 2024-03-04 DIAGNOSIS — F419 Anxiety disorder, unspecified: Secondary | ICD-10-CM

## 2024-03-04 DIAGNOSIS — F17200 Nicotine dependence, unspecified, uncomplicated: Secondary | ICD-10-CM

## 2024-03-04 MED ORDER — ALPRAZOLAM 0.5 MG PO TABS: 0.5000 mg | ORAL_TABLET | Freq: Two times a day (BID) | ORAL | 1 refills | Status: DC | PRN

## 2024-03-04 NOTE — Telephone Encounter (Signed)
  Chief Complaint: shaking "from not having any Xanax for 3 days" Symptoms: hands shaking. Increased anxiety Frequency: 3 days  Pertinent Negatives: Patient denies suicidal ideation Disposition: [] ED /[] Urgent Care (no appt availability in office) / [] Appointment(In office/virtual)/ []  [facility]/ [] Home Care/ [] Refused Recommended Disposition /[] [place] [facility]/ [x]  Follow-up with PCP Additional Notes: called CAL line and spoke with Moshe Cipro with pt complaint and need for med to be sent over to pharmacy ASAP. (Had previously called pharmacy and they did not receive it (class: print). Moshe Cipro stated that she will notify PCP. Called CAL line due to pt sx and need for med in addition to this note.  Copied from CRM [id number]. Topic: Clinical - Red Word Triage >> Mar 04, 2024 11:20 AM Elizebeth Brooking wrote: [facility] that prompted transfer to Nurse Triage: Patient called in stating she hasn't been able to get her  ALPRAZolam Prudy Feeler) 0.5 MG tablet , stating she has now started to shake, thinks she may be having an anxiety attack Reason for Disposition  MODERATE anxiety (e.g., persistent or frequent anxiety symptoms; interferes with sleep, school, or work)  Answer Assessment - Initial Assessment Questions 1. CONCERN: "Did anything happen that prompted you to call today?"      Needing Xanax off 2-3 days  2. ANXIETY SYMPTOMS: "Can you describe how you (your loved one; patient) have been feeling?" (e.g., tense, restless, panicky, anxious, keyed up, overwhelmed, sense of impending doom).      Shaking due to being off medication 3. ONSET: "How long have you been feeling this way?" (e.g., hours, days, weeks)     3 days  6. HISTORY: "Have you felt this way before?" "Have you ever been diagnosed with an anxiety problem in the past?" (e.g., generalized anxiety disorder, panic attacks, PTSD). If Yes, ask: "How was this problem treated?" (e.g., medicines, counseling, etc.)     Yes- Xanax 7. RISK OF HARM -  SUICIDAL IDEATION: "Do you ever have thoughts of hurting or killing yourself?" If Yes, ask:  "Do you have these feelings now?" "Do you have a plan on how you would do this?"     No  8. TREATMENT:  "What has been done so far to treat this anxiety?" (e.g., medicines, relaxation strategies). "What has helped?"     meds  12. OTHER SYMPTOMS: "Do you have any other symptoms?" (e.g., feeling depressed, trouble concentrating, trouble sleeping, trouble breathing, palpitations or fast heartbeat, chest pain, sweating, nausea, or diarrhea)       Shaking  Protocols used: Anxiety and Panic Attack-A-AH

## 2024-03-04 NOTE — Telephone Encounter (Signed)
 Current prescription was printed on 3/25, please advise if new rx can be sent to the pharmacy.

## 2024-03-17 NOTE — Telephone Encounter (Signed)
 Pt advised.

## 2024-03-17 NOTE — Telephone Encounter (Signed)
 Per insurance, the appeal for ProAir Respiclick has been approved:

## 2024-03-17 NOTE — Telephone Encounter (Signed)
 Please notify pt her proair respiclick is approved.

## 2024-06-25 ENCOUNTER — Other Ambulatory Visit: Payer: Self-pay

## 2024-06-25 ENCOUNTER — Telehealth: Payer: Self-pay

## 2024-06-25 DIAGNOSIS — F419 Anxiety disorder, unspecified: Secondary | ICD-10-CM

## 2024-06-25 NOTE — Telephone Encounter (Signed)
 Requesting a prescription  refill of Alprazolam  0.5mg  Last written 03/04/2024 Last OV 01/24/2024 Upcoming appt =  none

## 2024-06-25 NOTE — Telephone Encounter (Signed)
 Copied from CRM [id number]. Topic: Clinical - Prescription Issue >> Jun 25, 2024 11:42 AM Zane F wrote: Reason for CRM:   Patient is calling in after missing a call from the office; The PAS did not see a telephone encounter to pull from but the patient did inquire about her ALPRAZolam  (XANAX ) 0.5 MG tablet prescription. Patient was informed it has been started but patient is out of prescription and needs it refilled.  Please call patient back with update.   Preferred Pharmacy:   [facility] [address] [place] - [address]  [place] [address] Phone: [phone] Fax: [phone] Hours: Not open 24 hours   Callback Number: [phone]

## 2024-06-26 NOTE — Telephone Encounter (Signed)
 Rachael Duncan tried calling patient today. She is a former [facility] patient of mine. She never came back for her follow up appointments. She will need to be seen in office to get med refills. I am doubting she will want to transfer however and may want to consider establishing with one of the [facility] providers if that is the case. Thanks

## 2024-06-29 ENCOUNTER — Encounter: Payer: Self-pay | Admitting: Urgent Care

## 2024-07-02 NOTE — Telephone Encounter (Signed)
 Attempted call to patient . Left a detailed voice mail message requesting a return call to assist in scheduling a TOC appt. . A Mychart message had already been sent by provider on 06/29/2024 but was unread.

## 2024-07-07 NOTE — Telephone Encounter (Signed)
 Called again and left a detailed voice mail message on home # with return contact information for scheduling with [facility] Gave as TOC visit.

## 2024-07-13 ENCOUNTER — Other Ambulatory Visit: Payer: Self-pay | Admitting: Family Medicine

## 2024-07-13 DIAGNOSIS — F419 Anxiety disorder, unspecified: Secondary | ICD-10-CM

## 2024-07-13 NOTE — Telephone Encounter (Unsigned)
 Alprazolam : 03/04/24 60 tabs/1 RF

## 2024-07-13 NOTE — Telephone Encounter (Unsigned)
 Copied from CRM [id number]. Topic: Clinical - Medication Refill >> Jul 13, 2024 10:10 AM Macario HERO wrote: Medication: venlafaxine  XR (EFFEXOR  XR) 75 MG 24 hr capsule [[id number]] ALPRAZolam  (XANAX ) 0.5 MG tablet [[id number]]  Has the patient contacted their pharmacy? No (Agent: If no, request that the patient contact the pharmacy for the refill. If patient does not wish to contact the pharmacy document the reason why and proceed with request.) (Agent: If yes, when and what did the pharmacy advise?)  This is the patient's preferred pharmacy:  [facility] [address], [place] - [address] [place] [address] Phone: [phone] Fax: [phone]  Is this the correct pharmacy for this prescription? Yes If no, delete pharmacy and type the correct one.   Has the prescription been filled recently? Yes  Is the patient out of the medication? Yes  Has the patient been seen for an appointment in the last year OR does the patient have an upcoming appointment? Yes  Can we respond through MyChart? No  Agent: Please be advised that Rx refills may take up to 3 business days. We ask that you follow-up with your pharmacy.

## 2024-07-19 ENCOUNTER — Other Ambulatory Visit: Payer: Self-pay | Admitting: Urgent Care

## 2024-07-19 DIAGNOSIS — F419 Anxiety disorder, unspecified: Secondary | ICD-10-CM

## 2024-08-03 ENCOUNTER — Telehealth: Payer: Self-pay

## 2024-08-03 NOTE — Telephone Encounter (Signed)
 Copied from CRM [id number]. Topic: Clinical - Prescription Issue >> Jul 31, 2024  1:21 PM Drema MATSU wrote: Reason for CRM: Patient is calling to check on ALPRAZolam  (XANAX ) 0.5 MG tablet medication. Request was put in on 08/10 and is still pending. She has transfer of care appointment in October with Dr. Mcgowen. She is completely out.

## 2024-08-03 NOTE — Telephone Encounter (Signed)
 Requesting rx  rf of Alpraxolam 0.5mg   Last written 03/04/2024 Last OV 01/24/2024 [place] Upcoming appt 10/09/2024 transferring care to Dr. McGowen

## 2024-08-03 NOTE — Telephone Encounter (Signed)
 I am unable to refill this for this patient. I have only seen her once, maybe twice. I had told her she would need an office visit.

## 2024-08-05 ENCOUNTER — Ambulatory Visit: Admitting: Urgent Care

## 2024-08-05 ENCOUNTER — Encounter: Payer: Self-pay | Admitting: Urgent Care

## 2024-08-05 VITALS — BP 132/82 | HR 70 | Resp 18 | Ht 64.0 in | Wt 120.8 lb

## 2024-08-05 DIAGNOSIS — I10 Essential (primary) hypertension: Secondary | ICD-10-CM

## 2024-08-05 DIAGNOSIS — J432 Centrilobular emphysema: Secondary | ICD-10-CM

## 2024-08-05 DIAGNOSIS — F419 Anxiety disorder, unspecified: Secondary | ICD-10-CM | POA: Diagnosis not present

## 2024-08-05 DIAGNOSIS — E782 Mixed hyperlipidemia: Secondary | ICD-10-CM | POA: Diagnosis not present

## 2024-08-05 DIAGNOSIS — F17200 Nicotine dependence, unspecified, uncomplicated: Secondary | ICD-10-CM | POA: Diagnosis not present

## 2024-08-05 MED ORDER — METOPROLOL TARTRATE 25 MG PO TABS: 25.0000 mg | ORAL_TABLET | Freq: Two times a day (BID) | ORAL | 0 refills | Status: DC

## 2024-08-05 MED ORDER — ALPRAZOLAM 0.5 MG PO TABS
0.5000 mg | ORAL_TABLET | Freq: Two times a day (BID) | ORAL | 1 refills | Status: DC | PRN
Start: 2024-08-05 — End: 2024-10-09

## 2024-08-05 NOTE — Progress Notes (Signed)
 Established Patient Office Visit  Subjective:  Patient ID: Rachael Duncan, female    DOB: [birth year]  Age: 63 y.o. MRN: [id number]  Chief Complaint  Patient presents with   Anxiety    HPI  Discussed the use of AI scribe software for clinical note transcription with the patient, who gave verbal consent to proceed.  History of Present Illness   Rachael Duncan is a 63 year old female with anxiety and hypertension who presents for medication management.  She has been experiencing increased anxiety, particularly in the mornings, since running out of Xanax  a month ago. Her anxiety is exacerbated by various stressors, including work-related complaints and personal issues such as her dog's health and a fallen tree in her yard. She has been taking Xanax  since it was prescribed but started weaning herself to half a tablet to make it last longer. She has been experiencing shaking, which she attributes to being out of medication. No seizures.  She is currently taking venlafaxine  in the morning, which was started to help stabilize her mood and reduce the need for alprazolam . She does not feel the venlafaxine  is helping however her GAD7 and PHQ9 scores do show significant reduction.  She is on metoprolol  for hypertension and takes it in the morning. She does not regularly check her blood pressure at home due to issues with her machine.  She has emphysema and uses albuterol  as needed. She was previously on Anoro Ellipta  but experienced throat issues, which she thought were due to thrush. She does not use the inhaler daily.  She takes rosuvastatin  for cholesterol management. She also takes levothyroxine .      There are no active problems to display for this patient.  Past Medical History:  Diagnosis Date   Anxiety    Emphysema of lung (HCC)    Hyperlipidemia    Hypertension    Myocardial infarction [facility])    Thyroid  disease    History reviewed. No pertinent surgical history. Social  History   Tobacco Use   Smoking status: Every Day    Current packs/day: 0.50    Average packs/day: 0.5 packs/day for 40.0 years (20.0 ttl pk-yrs)    Types: Cigarettes   Smokeless tobacco: Never  Vaping Use   Vaping status: Never Used  Substance Use Topics   Alcohol use: Yes    Alcohol/week: 6.0 standard drinks of alcohol    Types: 6 Cans of beer per week   Drug use: No      ROS: as noted in HPI  Objective:     BP 132/82   Pulse 70   Resp 18   Ht 5' 4 (1.626 m)   Wt 120 lb 12 oz (54.8 kg)   SpO2 99%   BMI 20.73 kg/m  BP Readings from Last 3 Encounters:  08/05/24 132/82  01/24/24 134/84  12/11/18 128/86   Wt Readings from Last 3 Encounters:  08/05/24 120 lb 12 oz (54.8 kg)  01/24/24 119 lb 12.8 oz (54.3 kg)  12/11/18 118 lb (53.5 kg)      Physical Exam Vitals and nursing note reviewed.  Constitutional:      General: She is not in acute distress.    Appearance: Normal appearance. She is not ill-appearing, toxic-appearing or diaphoretic.  HENT:     Head: Normocephalic and atraumatic.     Mouth/Throat:     Mouth: Mucous membranes are moist.  Eyes:     General: No scleral icterus.  Right eye: No discharge.        Left eye: No discharge.     Extraocular Movements: Extraocular movements intact.     Pupils: Pupils are equal, round, and reactive to light.  Cardiovascular:     Rate and Rhythm: Normal rate and regular rhythm.     Heart sounds: Normal heart sounds. No murmur heard.    No friction rub.  Pulmonary:     Effort: Pulmonary effort is normal. No respiratory distress.     Breath sounds: Normal breath sounds. No stridor. No wheezing or rhonchi.  Musculoskeletal:     Cervical back: Normal range of motion and neck supple. No rigidity or tenderness.  Lymphadenopathy:     Cervical: No cervical adenopathy.  Skin:    General: Skin is warm and dry.     Coloration: Skin is not jaundiced.     Findings: No bruising, erythema or rash.  Neurological:      General: No focal deficit present.     Mental Status: She is alert and oriented to person, place, and time.     Motor: No weakness.     Gait: Gait normal.  Psychiatric:        Mood and Affect: Mood normal.        Behavior: Behavior normal.      No results found for any visits on 08/05/24.     08/05/2024   11:52 AM 01/24/2024    1:09 PM  Depression screen PHQ 2/9  Decreased Interest 1 2  Down, Depressed, Hopeless 1 2  PHQ - 2 Score 2 4  Altered sleeping 1 2  Tired, decreased energy 0 2  Change in appetite 0 0  Feeling bad or failure about yourself  0 1  Trouble concentrating 0 1  Moving slowly or fidgety/restless 0 0  Suicidal thoughts 0 0  PHQ-9 Score 3 10  Difficult doing work/chores Not difficult at all Somewhat difficult      08/05/2024   11:52 AM 01/24/2024    1:10 PM  GAD 7 : Generalized Anxiety Score  Nervous, Anxious, on Edge 1 3  Control/stop worrying 1 2  Worry too much - different things 1 2  Trouble relaxing 0 2  Restless 0 0  Easily annoyed or irritable 0 2  Afraid - awful might happen 0 3  Total GAD 7 Score 3 14  Anxiety Difficulty  Somewhat difficult      The ASCVD Risk score (Arnett DK, et al., 2019) failed to calculate for the following reasons:   Risk score cannot be calculated because patient has a medical history suggesting prior/existing ASCVD  Assessment & Plan:  Anxiety -     ALPRAZolam ; Take 1 tablet (0.5 mg total) by mouth 2 (two) times daily as needed for anxiety.  Dispense: 60 tablet; Refill: 1  Centrilobular emphysema (HCC)  Mixed hyperlipidemia  Primary hypertension -     Metoprolol  Tartrate; Take 1 tablet (25 mg total) by mouth 2 (two) times daily.  Dispense: 90 tablet; Refill: 0  Tobacco dependence  Assessment and Plan    Generalized anxiety disorder and depression Anxiety and depression improved with venlafaxine ; morning anxiety and shaking noted after stopping alprazolam . - Refill alprazolam  prescription. - Continue  venlafaxine .  Chronic obstructive pulmonary disease (COPD) COPD managed with Anoro Ellipta  and albuterol ; no daily albuterol  use; concern about thrush with Anoro Ellipta , which lacks steroids. - Continue Anoro Ellipta . - Use albuterol  as needed.  Hypertension Blood pressure improved to 132/82 mmHg;  stress-related exacerbation; metoprolol  taken in the morning. - Refill metoprolol  prescription.  Hyperlipidemia Hyperlipidemia managed with rosuvastatin . - Continue rosuvastatin , advise taking it at night.  Hypothyroidism Hypothyroidism managed with levothyroxine . - Continue current levothyroxine  regimen.      Pt will be establishing care with a provider in [facility] in two months. I have refilled enough medications to get her to this appointment.   No follow-ups on file.   Benton LITTIE Gave, PA

## 2024-08-05 NOTE — Patient Instructions (Signed)
 Continue your home medicaitons as ordered. I have refilled your metoprolol  (take twice daily) and your xanax . Take this as needed for panic. Continue your venlafaxine .  Please follow up with Dr. Candise on 10/09/24.

## 2024-09-09 ENCOUNTER — Ambulatory Visit: Payer: Self-pay

## 2024-09-09 DIAGNOSIS — M545 Low back pain, unspecified: Secondary | ICD-10-CM | POA: Diagnosis not present

## 2024-09-09 NOTE — Telephone Encounter (Signed)
 FYI Only or Action Required?: FYI only for provider.  Patient was last seen in primary care on 08/05/2024 by Lowella Benton CROME, PA.  Called Nurse Triage reporting Back Pain.  Symptoms began Monday .  Interventions attempted: Ice/heat application/ES acetaminophen .  Symptoms are: unchanged.  Triage Disposition: See HCP Within 4 Hours (Or PCP Triage)  Patient/caregiver understands and will follow disposition?: yes - pt wanted to be seen today and no openings       Copied from CRM #[id number]. Topic: Clinical - Red Word Triage >> Sep 09, 2024 11:00 AM Frederich PARAS wrote: [facility] that prompted transfer to Nurse Triage: pain  pt pulled something in her back, pt says it hurts really bad. Pt says the pain is mainly in lower back , really sore and every time she move it feels like something is pulling. Reason for Disposition  [1] SEVERE back pain (e.g., excruciating, unable to do any normal activities) AND [2] not improved 2 hours after pain medicine  Answer Assessment - Initial Assessment Questions 1. ONSET: When did the pain begin? (e.g., minutes, hours, days)     Monday  2. LOCATION: Where does it hurt? (upper, mid or lower back)     Lower back  3. SEVERITY: How bad is the pain?  (e.g., Scale 1-10; mild, moderate, or severe)     8/10 4. PATTERN: Is the pain constant? (e.g., yes, no; constant, intermittent)      Constant worse with movement  5. RADIATION: Does the pain shoot into your legs or somewhere else?     no 6. CAUSE:  What do you think is causing the back pain?      Strained muscle  7. BACK OVERUSE:  Any recent lifting of heavy objects, strenuous work or exercise?     yes 8. MEDICINES: What have you taken so far for the pain? (e.g., nothing, acetaminophen, NSAIDS)     Heating pad, ES acetaminophen  9. NEUROLOGIC SYMPTOMS: Do you have any weakness, numbness, or problems with bowel/bladder control?     no 10. OTHER SYMPTOMS: Do you have any other symptoms?  (e.g., fever, abdomen pain, burning with urination, blood in urine)       no 11. PREGNANCY: Is there any chance you are pregnant? When was your last menstrual period?       N/a  Protocols used: Back Pain-A-AH

## 2024-09-21 ENCOUNTER — Other Ambulatory Visit: Payer: Self-pay | Admitting: Urgent Care

## 2024-09-21 DIAGNOSIS — I10 Essential (primary) hypertension: Secondary | ICD-10-CM

## 2024-09-25 ENCOUNTER — Telehealth: Payer: Self-pay

## 2024-09-25 NOTE — Telephone Encounter (Signed)
 Copied from CRM [id number]. Topic: Clinical - Medication Question >> Sep 25, 2024 11:28 AM Drema MATSU wrote: Reason for CRM: Patient stated that she is completely out of medication. She is needing metoprolol  tartrate (LOPRESSOR ) 25 MG tablet sent to the pharmacy. Please call patient call patient. It has been over the 3 business day mark.

## 2024-09-25 NOTE — Telephone Encounter (Signed)
 Refill sent to [facility].

## 2024-10-09 ENCOUNTER — Ambulatory Visit: Payer: PRIVATE HEALTH INSURANCE | Admitting: Family Medicine

## 2024-10-09 ENCOUNTER — Encounter: Payer: Self-pay | Admitting: Family Medicine

## 2024-10-09 VITALS — BP 150/84 | HR 73 | Temp 98.1°F | Ht 64.0 in | Wt 123.0 lb

## 2024-10-09 DIAGNOSIS — R519 Headache, unspecified: Secondary | ICD-10-CM

## 2024-10-09 DIAGNOSIS — Z79899 Other long term (current) drug therapy: Secondary | ICD-10-CM

## 2024-10-09 DIAGNOSIS — I10 Essential (primary) hypertension: Secondary | ICD-10-CM | POA: Diagnosis not present

## 2024-10-09 DIAGNOSIS — Z0000 Encounter for general adult medical examination without abnormal findings: Secondary | ICD-10-CM

## 2024-10-09 DIAGNOSIS — Z1231 Encounter for screening mammogram for malignant neoplasm of breast: Secondary | ICD-10-CM

## 2024-10-09 DIAGNOSIS — E039 Hypothyroidism, unspecified: Secondary | ICD-10-CM | POA: Diagnosis not present

## 2024-10-09 DIAGNOSIS — F419 Anxiety disorder, unspecified: Secondary | ICD-10-CM | POA: Diagnosis not present

## 2024-10-09 DIAGNOSIS — J449 Chronic obstructive pulmonary disease, unspecified: Secondary | ICD-10-CM | POA: Diagnosis not present

## 2024-10-09 DIAGNOSIS — E7800 Pure hypercholesterolemia, unspecified: Secondary | ICD-10-CM | POA: Diagnosis not present

## 2024-10-09 DIAGNOSIS — Z124 Encounter for screening for malignant neoplasm of cervix: Secondary | ICD-10-CM | POA: Diagnosis not present

## 2024-10-09 LAB — POCT INFLUENZA A/B
Influenza A, POC: NEGATIVE
Influenza B, POC: NEGATIVE

## 2024-10-09 LAB — POC COVID19 BINAXNOW: SARS Coronavirus 2 Ag: NEGATIVE

## 2024-10-09 MED ORDER — METOPROLOL TARTRATE 50 MG PO TABS: 50.0000 mg | ORAL_TABLET | Freq: Two times a day (BID) | ORAL | 3 refills | Status: AC

## 2024-10-09 MED ORDER — ALPRAZOLAM 0.5 MG PO TABS: 0.5000 mg | ORAL_TABLET | Freq: Two times a day (BID) | ORAL | 5 refills | Status: AC | PRN

## 2024-10-09 NOTE — Patient Instructions (Signed)

## 2024-10-09 NOTE — Progress Notes (Signed)
 Office Note 10/09/2024  CC:  Chief Complaint  Patient presents with   Establish Care   HPI:  Rachael Duncan is a 63 y.o.  female who is here to establish/transfer care. Patient's most recent primary MD: Benton Gave, PA Old records in epic/health Link were reviewed prior to or during today's visit.  1 week more fatigued, some nausea w/out vomiting, occ loose stool, some HA and malaise.  +sick contacts. No fevers or SOB.  Stable chronic anxiety. PMP AWARE reviewed today: most recent rx for alprazolam  was filled 09/04/24, # 60, rx by Benton Gave, PA. No red flags.   Past Medical History:  Diagnosis Date   Anxiety    Emphysema of lung (HCC)    Hyperlipidemia    Hypertension    Hypothyroidism    Myocardial infarction (HCC)    Tobacco dependence     History reviewed. No pertinent surgical history.  Family History  Problem Relation Age of Onset   Cancer Maternal Grandmother    Hypertension Mother    Hypertension Father     Social History   Socioeconomic History   Marital status: Widowed    Spouse name: Not on file   Number of children: 2   Years of education: Not on file   Highest education level: 12th grade  Occupational History   Not on file  Tobacco Use   Smoking status: Every Day    Current packs/day: 0.50    Average packs/day: 0.5 packs/day for 40.0 years (20.0 ttl pk-yrs)    Types: Cigarettes   Smokeless tobacco: Never  Vaping Use   Vaping status: Never Used  Substance and Sexual Activity   Alcohol use: Yes    Alcohol/week: 6.0 standard drinks of alcohol    Types: 6 Cans of beer per week   Drug use: No   Sexual activity: Yes    Partners: Male    Birth control/protection: None  Other Topics Concern   Not on file  Social History Narrative   Not on file   Social Drivers of Health   Financial Resource Strain: Not on file  Food Insecurity: Not on file  Transportation Needs: No Transportation Needs (12/11/2018)   PRAPARE - Therapist, Art (Medical): No    Lack of Transportation (Non-Medical): No  Physical Activity: Inactive (11/28/2017)   Exercise Vital Sign    Days of Exercise per Week: 0 days    Minutes of Exercise per Session: 0 min  Stress: Stress Concern Present (11/28/2017)   [employer] of Occupational Health - Occupational Stress Questionnaire    Feeling of Stress : To some extent  Social Connections: Unknown (04/23/2022)   Received from [facility]   Social Network    Social Network: Not on file  Intimate Partner Violence: Unknown (04/23/2022)   Received from [facility]   HITS    Physically Hurt: Not on file    Insult or Talk Down To: Not on file    Threaten Physical Harm: Not on file    Scream or Curse: Not on file    Outpatient Encounter Medications as of 10/09/2024  Medication Sig   Albuterol  Sulfate (PROAIR  RESPICLICK) 108 (90 Base) MCG/ACT AEPB Inhale 2 puffs into the lungs every 6 (six) hours as needed.   ALPRAZolam  (XANAX ) 0.5 MG tablet Take 1 tablet (0.5 mg total) by mouth 2 (two) times daily as needed for anxiety.   aspirin EC 81 MG tablet Take 81 mg by mouth daily.  levothyroxine  (SYNTHROID ) 75 MCG tablet Take 1 tablet (75 mcg total) by mouth daily.   metoprolol  tartrate (LOPRESSOR ) 25 MG tablet Take 1 tablet by mouth twice daily   rosuvastatin  (CRESTOR ) 10 MG tablet Take 1 tablet (10 mg total) by mouth daily.   venlafaxine  XR (EFFEXOR  XR) 75 MG 24 hr capsule Take 1 capsule (75 mg total) by mouth daily with breakfast.   umeclidinium-vilanterol (ANORO ELLIPTA ) 62.5-25 MCG/ACT AEPB Inhale 1 puff into the lungs daily at 6 (six) AM. (Patient not taking: Reported on 10/09/2024)   No facility-administered encounter medications on file as of 10/09/2024.    Allergies  Allergen Reactions   Penicillins     Review of Systems  Constitutional:  Positive for fatigue. Negative for appetite change, chills and fever.  HENT:  Negative for congestion, dental problem, ear  pain and sore throat.   Eyes:  Negative for discharge, redness and visual disturbance.  Respiratory:  Negative for cough, chest tightness, shortness of breath and wheezing.   Cardiovascular:  Negative for chest pain, palpitations and leg swelling.  Gastrointestinal:  Positive for diarrhea and nausea. Negative for abdominal pain, blood in stool and vomiting.  Genitourinary:  Negative for difficulty urinating, dysuria, flank pain, frequency, hematuria and urgency.  Musculoskeletal:  Negative for arthralgias, back pain, joint swelling, myalgias and neck stiffness.  Skin:  Negative for pallor and rash.  Neurological:  Positive for headaches. Negative for dizziness, speech difficulty and weakness.  Hematological:  Negative for adenopathy. Does not bruise/bleed easily.  Psychiatric/Behavioral:  Negative for confusion and sleep disturbance. The patient is not nervous/anxious.     PE; Blood pressure (!) 150/84, pulse 73, temperature 98.1 F (36.7 C), temperature source Oral, height 5' 4 (1.626 m), weight 123 lb (55.8 kg), SpO2 99%. Body mass index is 21.11 kg/m.  Physical Exam  Gen: Alert, well appearing.  Patient is oriented to person, place, time, and situation. AFFECT: pleasant, lucid thought and speech. ENT: Ears: EACs clear, normal epithelium.  TMs with good light reflex and landmarks bilaterally.  Eyes: no injection, icteris, swelling, or exudate.  EOMI, PERRLA. Nose: no drainage or turbinate edema/swelling.  No injection or focal lesion.  Mouth: lips without lesion/swelling.  Oral mucosa pink and moist.  Dentition intact and without obvious caries or gingival swelling.  Oropharynx without erythema, exudate, or swelling.  Neck: supple/nontender.  No LAD, mass, or TM.  Carotid pulses 2+ bilaterally, without bruits. CV: RRR, no m/r/g.   LUNGS: CTA bilat, nonlabored resps, good aeration in all lung fields. ABD: soft, NT, ND, BS normal.  No hepatospenomegaly or mass.  No bruits. EXT: no  clubbing, cyanosis, or edema.  Musculoskeletal: no joint swelling, erythema, warmth, or tenderness.  ROM of all joints intact. Skin - no sores or suspicious lesions or rashes or color changes   Pertinent labs:  Last CBC Lab Results  Component Value Date   WBC 8.9 01/24/2024   HGB 15.8 (H) 01/24/2024   HCT 47.0 (H) 01/24/2024   MCV 96.0 01/24/2024   RDW 12.2 01/24/2024   PLT 282.0 01/24/2024   Last metabolic panel Lab Results  Component Value Date   GLUCOSE 102 (H) 01/24/2024   NA 139 01/24/2024   K 4.4 01/24/2024   CL 103 01/24/2024   CO2 26 01/24/2024   BUN 11 01/24/2024   CREATININE 0.79 01/24/2024   GFR 81.32 01/24/2024   CALCIUM  9.7 01/24/2024   PROT 7.9 01/24/2024   ALBUMIN 4.8 01/24/2024   BILITOT 0.8 01/24/2024  ALKPHOS 76 01/24/2024   AST 23 01/24/2024   ALT 21 01/24/2024   Last lipids Lab Results  Component Value Date   CHOL 141 01/24/2024   HDL 36.60 (L) 01/24/2024   LDLCALC 82 01/24/2024   TRIG 113.0 01/24/2024   CHOLHDL 4 01/24/2024   Last hemoglobin A1c Lab Results  Component Value Date   HGBA1C 5.5 01/24/2024   Last thyroid  functions Lab Results  Component Value Date   TSH 2.36 01/24/2024   T3TOTAL 80 01/24/2024   FREET4 0.69 01/24/2024   ASSESSMENT AND PLAN:   New/Transfer care:   #1 Health maintenance exam: Reviewed age and gender appropriate health maintenance issues (prudent diet, regular exercise, health risks of tobacco and excessive alcohol, use of seatbelts, fire alarms in home, use of sunscreen).  Also reviewed age and gender appropriate health screening as well as vaccine recommendations. Vaccines: Flu deferred today. Labs: lipid, cmet, tsh Cervical ca screening: pt request ref to GYN-->ordered today. Breast ca screening: mammogram ordered today Colon ca screening: discuss at next f/u visit. LUNG Ca screening:  CT ordered today. Enc smoking cessation.  #2 hypertension, poor control.  Increase Lopressor  to 50 mg twice a  day. Electrolytes and creatinine today.  3.  Viral syndrome. Flu and COVID testing here today is negative. Observed.  #4 COPD. Patient states asymptomatic. Anoro Ellipta  on med list but she is not on it.  #5 postsurgical hypothyroidism. Continue levothyroxine  75 mcg daily. TSH today.  6 GAD. Doing well on Effexor  XR 75 mg daily and alprazolam  0.5 mg twice a day as needed. #60, refill x 5 today.  7.  Hypercholesterolemia.  Stable on Crestor  10 mg a day. LDL goal less than 70. Lipid panel today.  An After Visit Summary was printed and given to the patient.   Signed:  Gerlene Hockey, MD           4 weeks follow-up hypertension

## 2024-10-10 ENCOUNTER — Ambulatory Visit: Payer: Self-pay | Admitting: Family Medicine

## 2024-10-11 ENCOUNTER — Encounter: Payer: Self-pay | Admitting: Family Medicine

## 2024-10-12 LAB — COMPREHENSIVE METABOLIC PANEL WITH GFR
AG Ratio: 1.8 (calc) (ref 1.0–2.5)
ALT: 24 U/L (ref 6–29)
AST: 30 U/L (ref 10–35)
Albumin: 5.1 g/dL (ref 3.6–5.1)
Alkaline phosphatase (APISO): 73 U/L (ref 37–153)
BUN: 7 mg/dL (ref 7–25)
CO2: 29 mmol/L (ref 20–32)
Calcium: 9.8 mg/dL (ref 8.6–10.4)
Chloride: 103 mmol/L (ref 98–110)
Creat: 0.73 mg/dL (ref 0.50–1.05)
Globulin: 2.8 g/dL (ref 1.9–3.7)
Glucose, Bld: 132 mg/dL — ABNORMAL HIGH (ref 65–99)
Potassium: 4.2 mmol/L (ref 3.5–5.3)
Sodium: 138 mmol/L (ref 135–146)
Total Bilirubin: 0.7 mg/dL (ref 0.2–1.2)
Total Protein: 7.9 g/dL (ref 6.1–8.1)
eGFR: 94 mL/min/1.73m2 (ref >=60)

## 2024-10-12 LAB — LIPID PANEL
Cholesterol: 163 mg/dL (ref <200)
HDL: 43 mg/dL — ABNORMAL LOW (ref >=50)
LDL Cholesterol (Calc): 96 mg/dL
Non-HDL Cholesterol (Calc): 120 mg/dL (ref <130)
Total CHOL/HDL Ratio: 3.8 (calc) (ref <5.0)
Triglycerides: 147 mg/dL (ref <150)

## 2024-10-12 LAB — TSH: TSH: 0.78 m[IU]/L (ref 0.40–4.50)

## 2024-10-12 LAB — HEMOGLOBIN A1C

## 2024-10-12 MED ORDER — ROSUVASTATIN CALCIUM 20 MG PO TABS: 20.0000 mg | ORAL_TABLET | Freq: Every day | ORAL | 1 refills | Status: AC

## 2024-10-21 ENCOUNTER — Other Ambulatory Visit: Payer: Self-pay | Admitting: Urgent Care

## 2024-10-21 DIAGNOSIS — F419 Anxiety disorder, unspecified: Secondary | ICD-10-CM

## 2024-10-26 ENCOUNTER — Ambulatory Visit: Payer: Self-pay

## 2024-10-26 NOTE — Telephone Encounter (Signed)
 FYI Only or Action Required?: FYI only for provider: Appt tomorrow afternoon.  Patient was last seen in primary care on 10/09/2024 by McGowen, Aleene DEL, MD.  Called Nurse Triage reporting Rectal Bleeding.  Symptoms began Saturday.  Interventions attempted: Other: pepto bismol. After taking this pt had a very dark stool.  Symptoms are: unchanged.  Triage Disposition: See Physician Within 24 Hours  Patient/caregiver understands and will follow disposition?: Yes                         Copied from CRM #[id number]. Topic: Clinical - Red Word Triage >> Oct 26, 2024 10:53 AM Alfonso ORN wrote: Red Word that prompted transfer to Nurse Triage: pt bleeding out of anus over the weekend, throwing up with white mucus in it .  Pt requesting vcmail be left because she is working and unable to use her phone. Reason for Disposition  MODERATE rectal bleeding (e.g., small blood clots, passing blood without stool, or toilet water turns red)  Answer Assessment - Initial Assessment Questions 1. APPEARANCE of BLOOD: What color is it? Is it passed separately, on the surface of the stool, or mixed in with the stool?      Mixed in with stool - lost about 1 cup 2. AMOUNT: How much blood was passed?      1 cup 3. FREQUENCY: How many times has blood been passed with the stools?      2 times 4. ONSET: When was the blood first seen in the stools? (Days or weeks)      Saturday - bright red,  5. DIARRHEA: Is there also some diarrhea? If Yes, ask: How many diarrhea stools in the past 24 hours?      Now has diarrhea 6. CONSTIPATION: Do you have constipation? If Yes, ask: How bad is it?     no 7. RECURRENT SYMPTOMS: Have you had blood in your stools before? If Yes, ask: When was the last time? and What happened that time?      no 8. BLOOD THINNERS: Do you take any blood thinners? (e.g., aspirin, clopidogrel / Plavix, coumadin, heparin). Notes: Other strong blood  thinners include: Arixtra (fondaparinux), Eliquis (apixaban), Pradaxa (dabigatran), and Xarelto (rivaroxaban).     no 9. OTHER SYMPTOMS: Do you have any other symptoms?  (e.g., abdomen pain, vomiting, dizziness, fever)     Seeing black dots - abdominal pain  Protocols used: Rectal Bleeding-A-AH

## 2024-10-26 NOTE — Telephone Encounter (Signed)
 noted

## 2024-10-27 ENCOUNTER — Ambulatory Visit (INDEPENDENT_AMBULATORY_CARE_PROVIDER_SITE_OTHER): Admitting: Sports Medicine

## 2024-10-27 ENCOUNTER — Other Ambulatory Visit: Payer: Self-pay

## 2024-10-27 ENCOUNTER — Encounter: Payer: Self-pay | Admitting: Sports Medicine

## 2024-10-27 VITALS — BP 111/75 | HR 73 | Temp 97.9°F | Wt 121.1 lb

## 2024-10-27 DIAGNOSIS — K921 Melena: Secondary | ICD-10-CM | POA: Diagnosis not present

## 2024-10-27 DIAGNOSIS — K219 Gastro-esophageal reflux disease without esophagitis: Secondary | ICD-10-CM | POA: Diagnosis not present

## 2024-10-27 DIAGNOSIS — R1030 Lower abdominal pain, unspecified: Secondary | ICD-10-CM

## 2024-10-27 DIAGNOSIS — F419 Anxiety disorder, unspecified: Secondary | ICD-10-CM

## 2024-10-27 MED ORDER — COVID-19 MRNA VAC-TRIS(PFIZER) 30 MCG/0.3ML IM SUSY: 0.3000 mL | PREFILLED_SYRINGE | Freq: Once | INTRAMUSCULAR | 0 refills | Status: AC

## 2024-10-27 MED ORDER — OMEPRAZOLE 40 MG PO CPDR: 40.0000 mg | DELAYED_RELEASE_CAPSULE | Freq: Every day | ORAL | 3 refills | Status: AC

## 2024-10-27 MED ORDER — VENLAFAXINE HCL ER 75 MG PO CP24: 75.0000 mg | ORAL_CAPSULE | Freq: Every day | ORAL | 1 refills | Status: AC

## 2024-10-27 NOTE — Progress Notes (Signed)
 Careteam: Patient Care Team: Candise Aleene DEL, MD as PCP - General (Family Medicine)  Allergies  Allergen Reactions   Penicillins     Chief Complaint  Patient presents with   Rectal Bleeding    Pt has been having rectal bleeding on and off for a few weeks now. She she did vomit once during that time.     Discussed the use of AI scribe software for clinical note transcription with the patient, who gave verbal consent to proceed.  History of Present Illness  Rachael Duncan is a 63 year old female who presents with rectal bleeding and abdominal pain.  She has been experiencing intermittent rectal bleeding since her last visit on 10/09/24.The bleeding initially occurred at night and was significant, described as 'a whole lot.' It subsided for a couple of weeks but recurred this past weekend, with the most recent episode on Saturday morning, where she noted about slight  blood in the stool. She has not had a colonoscopy in approximately ten years, during which polyps were found and removed.  She experienced vomiting on Saturday, described as 'yellowish mucus looking stuff.' She attempted to manage her symptoms by discontinuing [facility], taking Pepto Bismol, drinking ginger ale, and eating chicken noodle soup. Following the Pepto Bismol, her stool turned black. No current nausea or vomiting since Saturday.  She has a history of abdominal pain, which has worsened in the last few days, although it is not as severe today. Her bowel movements are typically regular, occurring in the morning, with the last one being this morning.  No recent diarrhea.  She has a history of taking ibuprofen, up to six tablets    Review of Systems:  Review of Systems  Constitutional:  Negative for chills and fever.  HENT:  Negative for congestion and sore throat.   Respiratory:  Negative for cough, sputum production and shortness of breath.   Cardiovascular:  Negative for chest pain, palpitations and  leg swelling.  Gastrointestinal:  Positive for blood in stool, nausea and vomiting. Negative for abdominal pain and heartburn.  Genitourinary:  Negative for dysuria, frequency and hematuria.  Musculoskeletal:  Negative for falls and myalgias.  Neurological:  Negative for dizziness, sensory change and focal weakness.   Negative unless indicated in HPI.   There are no active problems to display for this patient.  Past Medical History:  Diagnosis Date   Anxiety    Emphysema of lung (HCC)    Hyperlipidemia    Hypertension    Myocardial infarction [facility])    Postsurgical hypothyroidism    Tobacco dependence    Past Surgical History:  Procedure Laterality Date   COLONOSCOPY     11/2005 ?microscopic colitis   CORONARY ANGIOPLASTY WITH STENT PLACEMENT     2018   TRANSTHORACIC ECHOCARDIOGRAM     2018 grd I DD o/w normal   Social History   Tobacco Use   Smoking status: Every Day    Current packs/day: 0.50    Average packs/day: 0.5 packs/day for 40.0 years (20.0 ttl pk-yrs)    Types: Cigarettes   Smokeless tobacco: Never  Vaping Use   Vaping status: Never Used  Substance Use Topics   Alcohol use: Yes    Alcohol/week: 6.0 standard drinks of alcohol    Types: 6 Cans of beer per week   Drug use: No   Family History  Problem Relation Age of Onset   Cancer Maternal Grandmother    Hypertension Mother    Hypertension  Father    Allergies  Allergen Reactions   Penicillins     Medications: Patient's Medications  New Prescriptions   No medications on file  Previous Medications   ALBUTEROL  SULFATE (PROAIR  RESPICLICK) 108 (90 BASE) MCG/ACT AEPB    Inhale 2 puffs into the lungs every 6 (six) hours as needed.   ALPRAZOLAM  (XANAX ) 0.5 MG TABLET    Take 1 tablet (0.5 mg total) by mouth 2 (two) times daily as needed for anxiety.   ASPIRIN EC 81 MG TABLET    Take 81 mg by mouth daily.   LEVOTHYROXINE  (SYNTHROID ) 75 MCG TABLET    Take 1 tablet (75 mcg total) by mouth daily.    METOPROLOL  TARTRATE (LOPRESSOR ) 50 MG TABLET    Take 1 tablet (50 mg total) by mouth 2 (two) times daily.   ROSUVASTATIN  (CRESTOR ) 20 MG TABLET    Take 1 tablet (20 mg total) by mouth daily.   UMECLIDINIUM-VILANTEROL (ANORO ELLIPTA ) 62.5-25 MCG/ACT AEPB    Inhale 1 puff into the lungs daily at 6 (six) AM.   VENLAFAXINE  XR (EFFEXOR  XR) 75 MG 24 HR CAPSULE    Take 1 capsule (75 mg total) by mouth daily with breakfast.  Modified Medications   No medications on file  Discontinued Medications   No medications on file    Physical Exam: Vitals:   10/27/24 1438  BP: 111/75  Pulse: 73  Temp: 97.9 F (36.6 C)  TempSrc: Oral  SpO2: 96%  Weight: 121 lb 1.6 oz (54.9 kg)   Body mass index is 20.79 kg/m. BP Readings from Last 3 Encounters:  10/27/24 111/75  10/09/24 (!) 150/84  08/05/24 132/82   Wt Readings from Last 3 Encounters:  10/27/24 121 lb 1.6 oz (54.9 kg)  10/09/24 123 lb (55.8 kg)  08/05/24 120 lb 12 oz (54.8 kg)    Physical Exam Constitutional:      Appearance: Normal appearance.  HENT:     Head: Normocephalic and atraumatic.  Cardiovascular:     Rate and Rhythm: Normal rate and regular rhythm.  Pulmonary:     Effort: Pulmonary effort is normal. No respiratory distress.     Breath sounds: Normal breath sounds. No wheezing.  Abdominal:     General: Bowel sounds are normal. There is no distension.     Tenderness: There is no abdominal tenderness. There is no guarding or rebound.     Comments:    Genitourinary:    Rectum: Guaiac result negative.  Musculoskeletal:        General: No swelling or tenderness.  Neurological:     Mental Status: She is alert. Mental status is at baseline.     Sensory: No sensory deficit.     Motor: No weakness.     Labs reviewed: Basic Metabolic Panel: Recent Labs    01/24/24 1138 10/09/24 1430  NA 139 138  K 4.4 4.2  CL 103 103  CO2 26 29  GLUCOSE 102* 132*  BUN 11 7  CREATININE 0.79 0.73  CALCIUM  9.7 9.8  TSH 2.36 0.78    Liver Function Tests: Recent Labs    01/24/24 1138 10/09/24 1430  AST 23 30  ALT 21 24  ALKPHOS 76  --   BILITOT 0.8 0.7  PROT 7.9 7.9  ALBUMIN 4.8  --    No results for input(s): LIPASE, AMYLASE in the last 8760 hours. No results for input(s): AMMONIA in the last 8760 hours. CBC: Recent Labs    01/24/24 1138  WBC  8.9  NEUTROABS 4.7  HGB 15.8*  HCT 47.0*  MCV 96.0  PLT 282.0   Lipid Panel: Recent Labs    01/24/24 1138 10/09/24 1430  CHOL 141 163  HDL 36.60* 43*  LDLCALC 82 96  TRIG 113.0 147  CHOLHDL 4 3.8   TSH: Recent Labs    01/24/24 1138 10/09/24 1430  TSH 2.36 0.78   A1C: Lab Results  Component Value Date   HGBA1C CANCELED 10/09/2024    Assessment & Plan Blood in stool Pt c/o intermittent blood in the stool Fobt neg Ild epigastric tenderness Instructed patient to stop nsaids Will repeat labs Instructed patient to go to ED if experiences severe abdominal pain, bleeding  Orders:   Ambulatory referral to Gastroenterology   CBC with Differential/Platelet   Comp Met (CMET)  Lower abdominal pain  Orders:   Ambulatory referral to Gastroenterology   CBC with Differential/Platelet   Comp Met (CMET)  Gastroesophageal reflux disease, unspecified whether esophagitis present Avoid spicy foods Avoid nsaids Will start prilosec Instructed patient to cut down on smoking Orders:   omeprazole (PRILOSEC) 40 MG capsule; Take 1 capsule (40 mg total) by mouth daily.   Lipase

## 2024-10-28 LAB — COMPREHENSIVE METABOLIC PANEL WITH GFR
ALT: 22 U/L (ref 0–35)
AST: 28 U/L (ref 0–37)
Albumin: 5 g/dL (ref 3.5–5.2)
Alkaline Phosphatase: 63 U/L (ref 39–117)
BUN: 10 mg/dL (ref 6–23)
CO2: 32 meq/L (ref 19–32)
Calcium: 9.7 mg/dL (ref 8.4–10.5)
Chloride: 101 meq/L (ref 96–112)
Creatinine, Ser: 0.82 mg/dL (ref 0.40–1.20)
GFR: 77.35 mL/min (ref >60.00)
Glucose, Bld: 99 mg/dL (ref 70–99)
Potassium: 3.7 meq/L (ref 3.5–5.1)
Sodium: 140 meq/L (ref 135–145)
Total Bilirubin: 0.8 mg/dL (ref 0.2–1.2)
Total Protein: 7.9 g/dL (ref 6.0–8.3)

## 2024-10-28 LAB — CBC WITH DIFFERENTIAL/PLATELET
Basophils Absolute: 0.1 K/uL (ref 0.0–0.1)
Basophils Relative: 0.9 % (ref 0.0–3.0)
Eosinophils Absolute: 0.5 K/uL (ref 0.0–0.7)
Eosinophils Relative: 5.4 % — ABNORMAL HIGH (ref 0.0–5.0)
HCT: 46.8 % — ABNORMAL HIGH (ref 36.0–46.0)
Hemoglobin: 15.7 g/dL — ABNORMAL HIGH (ref 12.0–15.0)
Lymphocytes Relative: 28.5 % (ref 12.0–46.0)
Lymphs Abs: 2.5 K/uL (ref 0.7–4.0)
MCHC: 33.6 g/dL (ref 30.0–36.0)
MCV: 95.3 fl (ref 78.0–100.0)
Monocytes Absolute: 0.6 K/uL (ref 0.1–1.0)
Monocytes Relative: 6.5 % (ref 3.0–12.0)
Neutro Abs: 5.2 K/uL (ref 1.4–7.7)
Neutrophils Relative %: 58.7 % (ref 43.0–77.0)
Platelets: 265 K/uL (ref 150.0–400.0)
RBC: 4.91 Mil/uL (ref 3.87–5.11)
RDW: 12.7 % (ref 11.5–15.5)
WBC: 8.8 K/uL (ref 4.0–10.5)

## 2024-10-28 LAB — LIPASE: Lipase: 20 U/L (ref 11.0–59.0)

## 2024-10-29 ENCOUNTER — Ambulatory Visit: Payer: Self-pay | Admitting: Sports Medicine

## 2024-10-29 NOTE — Progress Notes (Signed)
Left pt a vm to return my call.

## 2024-11-11 ENCOUNTER — Ambulatory Visit: Payer: PRIVATE HEALTH INSURANCE | Admitting: Family Medicine

## 2024-11-13 ENCOUNTER — Ambulatory Visit: Payer: PRIVATE HEALTH INSURANCE

## 2024-12-22 ENCOUNTER — Other Ambulatory Visit: Payer: Self-pay | Admitting: Urgent Care

## 2024-12-22 DIAGNOSIS — E890 Postprocedural hypothyroidism: Secondary | ICD-10-CM

## 2024-12-23 ENCOUNTER — Ambulatory Visit
Admission: RE | Admit: 2024-12-23 | Discharge: 2024-12-23 | Disposition: A | Discharge status: Home / Self Care | Payer: PRIVATE HEALTH INSURANCE | Source: Ambulatory Visit | Attending: Family Medicine | Admitting: Family Medicine

## 2024-12-23 DIAGNOSIS — Z1231 Encounter for screening mammogram for malignant neoplasm of breast: Secondary | ICD-10-CM

## 2024-12-25 ENCOUNTER — Telehealth: Payer: Self-pay

## 2024-12-25 NOTE — Telephone Encounter (Signed)
 Communication  Reason for CRM: patient calling for mammogram results. I informed her once rcvd and reviewed the office will give her a call. She understood.   No further action needed at this time.

## 2024-12-26 ENCOUNTER — Other Ambulatory Visit: Payer: Self-pay | Admitting: Urgent Care

## 2024-12-26 DIAGNOSIS — E890 Postprocedural hypothyroidism: Secondary | ICD-10-CM

## 2024-12-28 ENCOUNTER — Other Ambulatory Visit: Payer: Self-pay

## 2024-12-28 DIAGNOSIS — E890 Postprocedural hypothyroidism: Secondary | ICD-10-CM

## 2024-12-28 MED ORDER — LEVOTHYROXINE SODIUM 75 MCG PO TABS: 75.0000 ug | ORAL_TABLET | Freq: Every day | ORAL | 1 refills | Status: AC

## 2024-12-28 NOTE — Telephone Encounter (Signed)
 Mammogram completely normal. I recommend repeat in 1 yr.

## 2024-12-28 NOTE — Telephone Encounter (Signed)
 LVM to discuss results. Last active on MyChart Feb. 2024.

## 2024-12-28 NOTE — Telephone Encounter (Signed)
 Copied from CRM [id number]. Topic: Clinical - Lab/Test Results >> Dec 28, 2024  9:45 AM Luevenia V wrote: Reason for CRM: Patient had her mammogram and results came back 12/25/24. Please review and call back with results. 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

## 2024-12-28 NOTE — Telephone Encounter (Signed)
 Spoke with pt. Pt aware and verbalized understanding.
